# Patient Record
Sex: Female | Born: 1955 | Hispanic: No | Marital: Married | Smoking: Never smoker
Health system: Southern US, Community
[De-identification: ages and names within clinical notes are randomized; demographics above are authoritative.]

## PROBLEM LIST (undated history)

## (undated) DIAGNOSIS — Z87898 Personal history of other specified conditions: Secondary | ICD-10-CM

## (undated) DIAGNOSIS — I38 Endocarditis, valve unspecified: Secondary | ICD-10-CM

## (undated) DIAGNOSIS — M810 Age-related osteoporosis without current pathological fracture: Secondary | ICD-10-CM

## (undated) HISTORY — DX: Personal history of other specified conditions: Z87.898

## (undated) HISTORY — DX: Age-related osteoporosis without current pathological fracture: M81.0

## (undated) HISTORY — DX: Endocarditis, valve unspecified: I38

---

## 2014-06-16 ENCOUNTER — Other Ambulatory Visit (HOSPITAL_COMMUNITY)
Admission: RE | Admit: 2014-06-16 | Discharge: 2014-06-16 | Disposition: A | Discharge status: Home / Self Care | Source: Ambulatory Visit | Attending: Obstetrics & Gynecology | Admitting: Obstetrics & Gynecology

## 2014-06-16 DIAGNOSIS — Z01419 Encounter for gynecological examination (general) (routine) without abnormal findings: Secondary | ICD-10-CM | POA: Insufficient documentation

## 2014-06-16 DIAGNOSIS — Z1151 Encounter for screening for human papillomavirus (HPV): Secondary | ICD-10-CM | POA: Diagnosis present

## 2014-07-12 ENCOUNTER — Institutional Professional Consult (permissible substitution): Payer: Self-pay | Admitting: Cardiology

## 2014-07-28 ENCOUNTER — Encounter: Payer: Self-pay | Admitting: Cardiology

## 2014-07-28 ENCOUNTER — Ambulatory Visit (INDEPENDENT_AMBULATORY_CARE_PROVIDER_SITE_OTHER): Admitting: Cardiology

## 2014-07-28 VITALS — BP 114/68 | HR 82 | Ht 61.0 in | Wt 99.0 lb

## 2014-07-28 DIAGNOSIS — R011 Cardiac murmur, unspecified: Secondary | ICD-10-CM | POA: Insufficient documentation

## 2014-07-28 DIAGNOSIS — R002 Palpitations: Secondary | ICD-10-CM | POA: Insufficient documentation

## 2014-07-28 NOTE — Progress Notes (Signed)
Connie Soto Date of Birth:  [birth year] [facility] [address] [place], [place]  [address] [phone]        Fax   [phone]   History of Present Illness: This pleasant 59 year old woman who has recently moved back to [place] from the [place] is a medical patient of Dr. Shirlean Mylararol Webb.  She is seen to establish cardiology care here in [place].  She has a past history of a mild heart murmur which was diagnosed in 2011 and followed by her previous cardiologist.  She had an echocardiogram several years ago which showed mild mitral regurgitation and mild tricuspid regurgitation.  The patient has a past history of palpitations.  These tend to occur during the day and not at night.  She sleeps well at night.  She has been experiencing the palpitations intermittently for the past 3-4 years.  She denies any history to suggest angina pectoris.  She has not had any symptoms to suggest congestive heart failure.  She reports that when her palpitations were first discovered, she apparently was not drinking enough water and was mildly dehydrated.  Once she made an effort to drink adequate water each day, the palpitations have decreased in intensity and frequency.  The patient does not have any history of high blood pressure diabetes or hypercholesterolemia.  She is a nonsmoker.  Family history reveals that her mother is alive and has a history of TIA.  Her father is deceased and had heart disease and COPD and congestive heart failure.  Current Outpatient Prescriptions  Medication Sig Dispense Refill  . aspirin 81 MG tablet Take 81 mg by mouth 3 (three) times a week. mon wed fri    . ibuprofen (ADVIL,MOTRIN) 200 MG tablet Take 200 mg by mouth every 6 (six) hours as needed for moderate pain.    . metaxalone (SKELAXIN) 800 MG tablet Take 800 mg by mouth daily as needed for muscle spasms.     No current facility-administered medications for this visit.     Allergies  Allergen Reactions  . Neomycin Swelling    Swelling in ear canal    Patient Active Problem List   Diagnosis Date Noted  . Intermittent palpitations 07/28/2014  . Heart murmur 07/28/2014    History  Smoking status  . Never Smoker   Smokeless tobacco  . Not on file    History  Alcohol Use  . 0.0 oz/week  . 0 Standard drinks or equivalent per week    History reviewed. No pertinent family history.  Review of Systems: Constitutional: no fever chills diaphoresis or fatigue or change in weight.  Head and neck: no hearing loss, no epistaxis, no photophobia or visual disturbance. Respiratory: No cough, shortness of breath or wheezing. Cardiovascular: No chest pain peripheral edema, palpitations. Gastrointestinal: No abdominal distention, no abdominal pain, no change in bowel habits hematochezia or melena. Genitourinary: No dysuria, no frequency, no urgency, no nocturia. Musculoskeletal:No arthralgias, no back pain, no gait disturbance or myalgias. Neurological: No dizziness, no headaches, no numbness, no seizures, no syncope, no weakness, no tremors. Hematologic: No lymphadenopathy, no easy bruising. Psychiatric: No confusion, no hallucinations, no sleep disturbance.   Wt Readings from Last 3 Encounters:  07/28/14 99 lb (44.906 kg)    Physical Exam: Filed Vitals:   07/28/14 1442  BP: 114/68  Pulse: 82  The patient appears to be in no distress.  Head and neck exam reveals that the pupils are equal and reactive.  The  extraocular movements are full.  There is no scleral icterus.  Mouth and pharynx are benign.  No lymphadenopathy.  No carotid bruits.  The jugular venous pressure is normal.  Thyroid is not enlarged or tender.  Chest is clear to percussion and auscultation.  No rales or rhonchi.  Expansion of the chest is symmetrical.  Heart reveals no abnormal lift or heave.  First and second heart sounds are normal.  There is no  gallop rub or click.  There  is a very faint systolic murmur at the left sternal border fourth intercostal space.  The abdomen is soft and nontender.  Bowel sounds are normoactive.  There is no hepatosplenomegaly or mass.  There are no abdominal bruits.  Extremities reveal no phlebitis or edema.  Pedal pulses are good.  There is no cyanosis or clubbing.  Neurologic exam is normal strength and no lateralizing weakness.  No sensory deficits.  Integument reveals no rash  EKG today shows normal sinus rhythm with rightward axis and no ischemic changes.  There were no premature beats noted on his EKG.  Assessment / Plan: 1.  History of a heart murmur with previous echo demonstrating mild tricuspid regurgitation and mild mitral regurgitation. 2.  Palpitations  Disposition: We will have the patient return for an echocardiogram to update her information.  At this point she does not require any additional medications.  He does take a baby aspirin 3 days a week and I would suggest that she continue to do this. Many thanks for the opportunity to see this pleasant woman with you.  We will be in touch regarding the results of the echocardiogram.  Return here when necessary.

## 2014-07-28 NOTE — Patient Instructions (Signed)

## 2014-08-02 ENCOUNTER — Ambulatory Visit (HOSPITAL_COMMUNITY): Attending: Cardiology | Admitting: Cardiology

## 2014-08-02 DIAGNOSIS — R011 Cardiac murmur, unspecified: Secondary | ICD-10-CM | POA: Insufficient documentation

## 2014-08-02 DIAGNOSIS — R002 Palpitations: Secondary | ICD-10-CM | POA: Diagnosis present

## 2014-08-02 NOTE — Progress Notes (Signed)
Echo performed. 

## 2014-08-05 ENCOUNTER — Telehealth: Payer: Self-pay | Admitting: Cardiology

## 2014-08-05 NOTE — Telephone Encounter (Signed)
-----   Message from Cassell Clementhomas Brackbill, MD sent at 08/03/2014  5:39 PM EST ----- Please report.  The echocardiogram is normal.  There was trivial tricuspid regurgitation.  Left ventricular systolic function is normal with ejection fraction of 50-55%.  No new medications are indicated. Please send a copy of the echo to Dr. Shirlean Mylararol Webb

## 2014-08-05 NOTE — Telephone Encounter (Signed)
Advised patient

## 2014-08-05 NOTE — Telephone Encounter (Signed)
Follow Up  Pt returned call to nurse

## 2014-09-15 ENCOUNTER — Other Ambulatory Visit: Payer: Self-pay

## 2014-09-15 DIAGNOSIS — Z1231 Encounter for screening mammogram for malignant neoplasm of breast: Secondary | ICD-10-CM

## 2014-09-22 ENCOUNTER — Ambulatory Visit
Admission: RE | Admit: 2014-09-22 | Discharge: 2014-09-22 | Disposition: A | Discharge status: Home / Self Care | Payer: TRICARE For Life (TFL) | Source: Ambulatory Visit

## 2014-09-22 DIAGNOSIS — Z1231 Encounter for screening mammogram for malignant neoplasm of breast: Secondary | ICD-10-CM

## 2014-10-14 ENCOUNTER — Other Ambulatory Visit: Payer: Self-pay | Admitting: Family Medicine

## 2014-10-14 DIAGNOSIS — R928 Other abnormal and inconclusive findings on diagnostic imaging of breast: Secondary | ICD-10-CM

## 2014-10-20 ENCOUNTER — Ambulatory Visit
Admission: RE | Admit: 2014-10-20 | Discharge: 2014-10-20 | Disposition: A | Discharge status: Home / Self Care | Source: Ambulatory Visit | Attending: Family Medicine | Admitting: Family Medicine

## 2014-10-20 DIAGNOSIS — R928 Other abnormal and inconclusive findings on diagnostic imaging of breast: Secondary | ICD-10-CM

## 2015-09-06 ENCOUNTER — Other Ambulatory Visit: Payer: Self-pay

## 2015-09-06 DIAGNOSIS — Z1231 Encounter for screening mammogram for malignant neoplasm of breast: Secondary | ICD-10-CM

## 2015-09-15 ENCOUNTER — Other Ambulatory Visit: Payer: Self-pay

## 2015-09-15 DIAGNOSIS — Z1231 Encounter for screening mammogram for malignant neoplasm of breast: Secondary | ICD-10-CM

## 2015-09-27 ENCOUNTER — Ambulatory Visit
Admission: RE | Admit: 2015-09-27 | Discharge: 2015-09-27 | Disposition: A | Discharge status: Home / Self Care | Source: Ambulatory Visit

## 2015-09-27 DIAGNOSIS — Z1231 Encounter for screening mammogram for malignant neoplasm of breast: Secondary | ICD-10-CM

## 2015-09-28 ENCOUNTER — Ambulatory Visit

## 2015-09-29 ENCOUNTER — Other Ambulatory Visit: Payer: Self-pay | Admitting: Family Medicine

## 2015-09-29 DIAGNOSIS — R928 Other abnormal and inconclusive findings on diagnostic imaging of breast: Secondary | ICD-10-CM

## 2015-10-07 ENCOUNTER — Ambulatory Visit
Admission: RE | Admit: 2015-10-07 | Discharge: 2015-10-07 | Disposition: A | Discharge status: Home / Self Care | Source: Ambulatory Visit | Attending: Family Medicine | Admitting: Family Medicine

## 2015-10-07 DIAGNOSIS — R928 Other abnormal and inconclusive findings on diagnostic imaging of breast: Secondary | ICD-10-CM

## 2016-07-20 ENCOUNTER — Other Ambulatory Visit: Payer: Self-pay | Admitting: Obstetrics & Gynecology

## 2016-07-20 DIAGNOSIS — Z1231 Encounter for screening mammogram for malignant neoplasm of breast: Secondary | ICD-10-CM

## 2016-08-07 ENCOUNTER — Telehealth: Payer: Self-pay

## 2016-08-07 NOTE — Telephone Encounter (Signed)
NOTES SENT TO SCHEDULING.  °

## 2016-08-16 ENCOUNTER — Encounter: Payer: Self-pay | Admitting: Cardiology

## 2016-08-27 ENCOUNTER — Ambulatory Visit (INDEPENDENT_AMBULATORY_CARE_PROVIDER_SITE_OTHER)
Admission: RE | Admit: 2016-08-27 | Discharge: 2016-08-27 | Disposition: A | Discharge status: Home / Self Care | Payer: Self-pay | Source: Ambulatory Visit | Attending: Cardiology | Admitting: Cardiology

## 2016-08-27 ENCOUNTER — Encounter (INDEPENDENT_AMBULATORY_CARE_PROVIDER_SITE_OTHER): Payer: Self-pay

## 2016-08-27 ENCOUNTER — Ambulatory Visit (INDEPENDENT_AMBULATORY_CARE_PROVIDER_SITE_OTHER): Admitting: Cardiology

## 2016-08-27 ENCOUNTER — Encounter: Payer: Self-pay | Admitting: Cardiology

## 2016-08-27 VITALS — BP 110/56 | HR 92 | Ht 61.0 in | Wt 93.0 lb

## 2016-08-27 DIAGNOSIS — E784 Other hyperlipidemia: Secondary | ICD-10-CM | POA: Diagnosis not present

## 2016-08-27 DIAGNOSIS — R002 Palpitations: Secondary | ICD-10-CM | POA: Diagnosis not present

## 2016-08-27 DIAGNOSIS — E7849 Other hyperlipidemia: Secondary | ICD-10-CM

## 2016-08-27 DIAGNOSIS — Z8249 Family history of ischemic heart disease and other diseases of the circulatory system: Secondary | ICD-10-CM

## 2016-08-27 DIAGNOSIS — E785 Hyperlipidemia, unspecified: Secondary | ICD-10-CM | POA: Insufficient documentation

## 2016-08-27 NOTE — Progress Notes (Addendum)
Cardiology Office Note    Date:  08/27/2016   ID:  Connie Soto, DOB 06/07/1955, MRN [id number]  PCP:  Frederich Chick, MD  Cardiologist:  Tobias Alexander, MD  Referring physician: Shirlean Mylar, MD  Chief complain: Palpitations, family history of premature coronary artery disease  History of Present Illness:  Connie Soto is a 61 y.o. female, Who is a very pleasant patient, previously seen by Dr. Patty Sermons. The patient is coming for concern of hyperlipidemia, palpitations and sudden cardiac death in her brother. Her brother was 76 with no known coronary artery disease and no chest pain died suddenly and was found that at work. The patient is very active and walks treadmill and lifts weights 4-5 times a week, and is asymptomatic with chest pain or shortness of breath. She also denies any lower extremity edema, no claudications no orthopnea or paroxysmal nocturnal dyspnea. She has noticed a few second lasting palpitations they're not associated with shortness of breath or chest pain dizziness or syncope. She eats very healthy mostly vegetarian diet and was told in the past that her cholesterol is borderline but does need to be treated. She also has history of osteoporosis and arthritis for which she takes occasional ibuprofen.  Past Medical History:  Diagnosis Date  . History of palpitations   . Osteoporosis   . Valvular regurgitation     No past surgical history on file.  Current Medications: Outpatient Medications Prior to Visit  Medication Sig Dispense Refill  . aspirin 81 MG tablet Take 81 mg by mouth 3 (three) times a week. mon wed fri    . ibuprofen (ADVIL,MOTRIN) 200 MG tablet Take 200 mg by mouth every 6 (six) hours as needed for moderate pain.    . metaxalone (SKELAXIN) 800 MG tablet Take 800 mg by mouth daily as needed for muscle spasms.     No facility-administered medications prior to visit.      Allergies:   Neomycin   Social History   Social History  .  Marital status: Married    Spouse name: N/A  . Number of children: N/A  . Years of education: N/A   Social History Main Topics  . Smoking status: Never Smoker  . Smokeless tobacco: Never Used  . Alcohol use 0.0 oz/week  . Drug use: No  . Sexual activity: Not Asked   Other Topics Concern  . None   Social History Narrative  . None     Family History:  The patient's family history includes Breast cancer in her paternal grandmother; COPD in her father; Emphysema in her father; Healthy in her daughter, daughter, and son; Heart Problems in her father; Heart attack in her brother; Hypertension in her father; Scoliosis in her mother; Transient ischemic attack in her mother.   ROS:   Please see the history of present illness.    ROS All other systems reviewed and are negative.   PHYSICAL EXAM:   VS:  BP (!) 110/56   Pulse 92   Ht 5\' 1"  (1.549 m)   Wt 93 lb (42.2 kg)   BMI 17.57 kg/m    GEN: Well nourished, well developed, in no acute distress  HEENT: normal  Neck: no JVD, carotid bruits, or masses Cardiac: RRR; no murmurs, rubs, or gallops,no edema  Respiratory:  clear to auscultation bilaterally, normal work of breathing GI: soft, nontender, nondistended, + BS MS: no deformity or atrophy  Skin: warm and dry, no rash Neuro:  Alert and Oriented x 3,  Strength and sensation are intact Psych: euthymic mood, full affect  Wt Readings from Last 3 Encounters:  08/27/16 93 lb (42.2 kg)  07/28/14 99 lb (44.9 kg)      Studies/Labs Reviewed:   EKG:  EKG is ordered today.  The ekg ordered today demonstrates Normal sinus rhythm with rightward axis, this is unchanged from prior EKG in 2016. This was personally reviewed.  Recent Labs: No results found for requested labs within last 8760 hours.   Lipid Panel No results found for: CHOL, TRIG, HDL, CHOLHDL, VLDL, LDLCALC, LDLDIRECT  Additional studies/ records that were reviewed today include:   TTE:  - Left ventricle: The cavity  size was normal. Wall thickness was normal. Systolic function was normal. The estimated ejection fraction was in the range of 50% to 55%. Wall motion was normal; there were no regional wall motion abnormalities.    ASSESSMENT:    1. Intermittent palpitations   2. Family history of early CAD   3. Other hyperlipidemia      PLAN:  In order of problems listed above:  1. The patient is asymptomatic but so was her brother who died suddenly. We will schedule an exercise treadmill stress test. 2. Hyperlipidemia, triglycerides 44 HDL 92 LDL 108. We will order calcium score to further risk stratify and determine if she needs therapy with statins., Meanwhile she is advised to continue exercising and healthy Mediterranean style diet. 3. Calcium score was personally reviewed, and it was 0, the patient was called, and recommended to start just red yeast right 600 mg daily but no prescription statin is needed. 4. Palpitations, minimal no Holter monitor needed at this time.    Medication Adjustments/Labs and Tests Ordered: Current medicines are reviewed at length with the patient today.  Concerns regarding medicines are outlined above.  Medication changes, Labs and Tests ordered today are listed in the Patient Instructions below. Patient Instructions  Medication Instructions:   Your physician recommends that you continue on your current medications as directed. Please refer to the Current Medication list given to you today.    Testing/Procedures:  Your physician has requested that you have an exercise tolerance test. For further information please visit [email or web address]. Please also follow instruction sheet, as given.   YOU WILL HAVE A CT CARDIAC SCORING TEST (CALCIUM SCORE) DONE TODAY IN OUR OFFICE     Follow-Up:  Your physician wants you to follow-up in: ONE YEAR WITH DR Johnell ComingsNELSON You will receive a reminder letter in the mail two months in advance. If you don't receive a  letter, please call our office to schedule the follow-up appointment.        If you need a refill on your cardiac medications before your next appointment, please call your pharmacy.      Signed, Tobias AlexanderKatarina Shweta Aman, MD  08/27/2016 9:35 AM    [facility] [address], [place], [place]  [address] Phone: [phone]; Fax: [phone]

## 2016-08-27 NOTE — Addendum Note (Signed)
Addended by: Lars MassonNELSON, Zekiel Torian H on: 08/27/2016 10:51 AM   Modules accepted: Level of Service

## 2016-08-27 NOTE — Patient Instructions (Signed)
Medication Instructions:   Your physician recommends that you continue on your current medications as directed. Please refer to the Current Medication list given to you today.    Testing/Procedures:  Your physician has requested that you have an exercise tolerance test. For further information please visit [email or web address] Please also follow instruction sheet, as given.   YOU WILL HAVE A CT CARDIAC SCORING TEST (CALCIUM SCORE) DONE TODAY IN OUR OFFICE     Follow-Up:  Your physician wants you to follow-up in: ONE YEAR WITH DR Johnell ComingsNELSON You will receive a reminder letter in the mail two months in advance. If you don't receive a letter, please call our office to schedule the follow-up appointment.        If you need a refill on your cardiac medications before your next appointment, please call your pharmacy.

## 2016-09-04 ENCOUNTER — Ambulatory Visit (INDEPENDENT_AMBULATORY_CARE_PROVIDER_SITE_OTHER)

## 2016-09-04 DIAGNOSIS — R002 Palpitations: Secondary | ICD-10-CM | POA: Diagnosis not present

## 2016-09-04 DIAGNOSIS — Z8249 Family history of ischemic heart disease and other diseases of the circulatory system: Secondary | ICD-10-CM | POA: Diagnosis not present

## 2016-09-04 DIAGNOSIS — E784 Other hyperlipidemia: Secondary | ICD-10-CM

## 2016-09-04 DIAGNOSIS — E7849 Other hyperlipidemia: Secondary | ICD-10-CM

## 2016-09-04 LAB — EXERCISE TOLERANCE TEST
Estimated workload: 13.4 METS
Exercise duration (min): 11 min
Exercise duration (sec): 0 s
MPHR: 160 {beats}/min
Peak HR: 160 {beats}/min
Percent HR: 101 %
RPE: 15
Rest HR: 82 {beats}/min

## 2016-10-24 ENCOUNTER — Ambulatory Visit
Admission: RE | Admit: 2016-10-24 | Discharge: 2016-10-24 | Disposition: A | Discharge status: Home / Self Care | Source: Ambulatory Visit | Attending: Obstetrics & Gynecology | Admitting: Obstetrics & Gynecology

## 2016-10-24 ENCOUNTER — Other Ambulatory Visit: Payer: Self-pay | Admitting: Obstetrics & Gynecology

## 2016-10-24 DIAGNOSIS — Z1231 Encounter for screening mammogram for malignant neoplasm of breast: Secondary | ICD-10-CM

## 2017-08-30 ENCOUNTER — Other Ambulatory Visit: Payer: Self-pay | Admitting: Obstetrics & Gynecology

## 2017-08-30 DIAGNOSIS — Z1231 Encounter for screening mammogram for malignant neoplasm of breast: Secondary | ICD-10-CM

## 2017-09-20 ENCOUNTER — Encounter: Payer: Self-pay | Admitting: Physician Assistant

## 2017-09-26 ENCOUNTER — Ambulatory Visit: Admitting: Physician Assistant

## 2017-09-27 ENCOUNTER — Other Ambulatory Visit: Payer: Self-pay | Admitting: Obstetrics & Gynecology

## 2017-09-27 DIAGNOSIS — N644 Mastodynia: Secondary | ICD-10-CM

## 2017-10-01 NOTE — Progress Notes (Addendum)
Cardiology Office Note    Date:  10/02/2017   ID:  Don Perking, DOB 18-Jan-1956, MRN [id number]  PCP:  Shirlean Mylar, MD  Cardiologist: Tobias Alexander, MD  Chief Complaint  Patient presents with  . Follow-up    History of Present Illness:  Connie Soto is a 62 y.o. female with history of palpitations, HLD-borderline diet-controlled and strong family history of early CAD.  Brother died suddenly at 74 of an MI.  2D echo in 2016 normal.  Last saw Dr. Delton See 08/2016 who ordered a GXT as well as calcium score to risk stratify and determine if she needs therapy with statins.  Calcium score was 0 red yeast rice 600 mg daily was recommended.  GXT normal 09/2016.  Patient comes in for yearly follow-up.  She has occasional palpitations but can cough and it subsides.  She is been a little dizzy or off balance but thinks she is not drinking enough water and is going to focus on staying hydrated.  No presyncope.  Denies chest pain, dyspnea, dyspnea on exertion, edema.  Exercising on the treadmill or elliptical 30 minutes 4 days a week and also doing weights.  Not doing as much recently because of plantar fasciitis.  Lipid profile done in March shows an LDL of 103 which she was concerned about because she is taking the red yeast rice.  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    Past Medical History:  Diagnosis Date  . History of palpitations   . Osteoporosis   . Valvular regurgitation     No past surgical history on file.  Current Medications: Current Meds  Medication Sig  . Calcium Carb-Cholecalciferol (CALCIUM + D3) 600-200 MG-UNIT TABS Take by mouth daily.  Marland Kitchen ibuprofen (ADVIL,MOTRIN) 200 MG tablet Take 200 mg by mouth every 6 (six) hours as needed for moderate pain.  . Magnesium 200 MG TABS Take 200 mg by mouth daily.  . Multiple Vitamin (MULTIVITAMIN) tablet Take 1 tablet by mouth daily.  . Red Yeast Rice 600 MG CAPS Take 600 mg by mouth daily.  . [DISCONTINUED] aspirin  81 MG tablet Take 81 mg by mouth 3 (three) times a week. mon wed fri     Allergies:   Neomycin   Social History   Socioeconomic History  . Marital status: Married    Spouse name: Not on file  . Number of children: Not on file  . Years of education: Not on file  . Highest education level: Not on file  Occupational History  . Not on file  Social Needs  . Financial resource strain: Not on file  . Food insecurity:    Worry: Not on file    Inability: Not on file  . Transportation needs:    Medical: Not on file    Non-medical: Not on file  Tobacco Use  . Smoking status: Never Smoker  . Smokeless tobacco: Never Used  Substance and Sexual Activity  . Alcohol use: Yes    Alcohol/week: 0.0 oz  . Drug use: No  . Sexual activity: Not on file  Lifestyle  . Physical activity:    Days per week: Not on file    Minutes per session: Not on file  . Stress: Not on file  Relationships  . Social connections:    Talks on phone: Not on file    Gets together: Not on file    Attends religious service: Not on file    Active member of club or  organization: Not on file    Attends meetings of clubs or organizations: Not on file    Relationship status: Not on file  Other Topics Concern  . Not on file  Social History Narrative  . Not on file     Family History:  The patient's family history includes Breast cancer in her maternal grandmother and paternal grandmother; COPD in her father; Emphysema in her father; Healthy in her daughter, daughter, and son; Heart Problems in her father; Heart attack in her brother; Hypertension in her father; Scoliosis in her mother; Transient ischemic attack in her mother.   ROS:   Please see the history of present illness.    Review of Systems  Constitution: Negative.  HENT: Negative.   Eyes: Negative.   Cardiovascular: Positive for palpitations.  Respiratory: Negative.   Hematologic/Lymphatic: Negative.   Musculoskeletal: Negative.  Negative for joint  pain.       Plantar fasciitis  Gastrointestinal: Negative.   Genitourinary: Negative.   Neurological: Positive for light-headedness and loss of balance.   All other systems reviewed and are negative.   PHYSICAL EXAM:   VS:  BP 108/70   Pulse 80   Ht 5' 0.5" (1.537 m)   Wt 97 lb (44 kg)   BMI 18.63 kg/m   Physical Exam  GEN: Thin, in no acute distress  Neck: no JVD, carotid bruits, or masses Cardiac:RRR; no murmurs, rubs, or gallops  Respiratory:  clear to auscultation bilaterally, normal work of breathing GI: soft, nontender, nondistended, + BS Ext: without cyanosis, clubbing, or edema, Good distal pulses bilaterally Neuro:  Alert and Oriented x 3 Psych: euthymic mood, full affect  Wt Readings from Last 3 Encounters:  10/02/17 97 lb (44 kg)  08/27/16 93 lb (42.2 kg)  07/28/14 99 lb (44.9 kg)      Studies/Labs Reviewed:   EKG:  EKG is  ordered today.  The ekg ordered today demonstrates normal sinus rhythm, normal EKG  Recent Labs: No results found for requested labs within last 8760 hours.   Lipid Panel No results found for: CHOL, TRIG, HDL, CHOLHDL, VLDL, LDLCALC, LDLDIRECT  Additional studies/ records that were reviewed today include:  GXT 09/2016 Study Highlights      Blood pressure demonstrated a normal response to exercise.  There was no ST segment deviation noted during stress.  No T wave inversion was noted during stress.   Normal ECG stress test.     2D echo 2016Study Conclusions  - Left ventricle: The cavity size was normal. Wall thickness was   normal. Systolic function was normal. The estimated ejection   fraction was in the range of 50% to 55%. Wall motion was normal;   there were no regional wall motion abnormalities.  CT calcium score 3/2018IMPRESSION: Coronary calcium score of 0. This was 0 percentile for age and sex matched control.   Tobias Alexander     Electronically Signed   By: Tobias Alexander   On: 08/27/2016 10:46       ASSESSMENT:    1. Intermittent palpitations   2. Mixed hyperlipidemia   3. Family history of early CAD      PLAN:  In order of problems listed above:  Intermittent palpitations relieved with cough not bothersome.  No Holter at this time unless recurrent symptoms.  Hyperlipidemia calcium score is 0 so no statin needed.  Red yeast rice recommended.  LDL 08/27/2017 103 total cholesterol 207.  Could try soluble fiber to see if this helps.  Patient follows a Mediterranean diet and is very healthy.  Family history of early CAD with brother dying at 101  Normal GXT 09/2016, calcium score 0.  Continue to monitor yearly.  Can stop aspirin based on recent data.    Medication Adjustments/Labs and Tests Ordered: Current medicines are reviewed at length with the patient today.  Concerns regarding medicines are outlined above.  Medication changes, Labs and Tests ordered today are listed in the Patient Instructions below. Patient Instructions  Medication Instructions:  Your physician has recommended you make the following change in your medication:  1- STOP Aspirin  Labwork: NONE  Testing/Procedures: NONE  Follow-Up: Your physician wants you to follow-up in: 12 months with Dr. Delton See. You will receive a reminder letter in the mail two months in advance. If you don't receive a letter, please call our office to schedule the follow-up appointment.  Your physician recommended that you exercise 150 minutes per week or 30 minutes 4 times a week plus weight training.    If you need a refill on your cardiac medications before your next appointment, please call your pharmacy.    Exercising to Stay Healthy Exercising regularly is important. It has many health benefits, such as:  Improving your overall fitness, flexibility, and endurance.  Increasing your bone density.  Helping with weight control.  Decreasing your body fat.  Increasing your muscle strength.  Reducing stress and  tension.  Improving your overall health.  In order to become healthy and stay healthy, it is recommended that you do moderate-intensity and vigorous-intensity exercise. You can tell that you are exercising at a moderate intensity if you have a higher heart rate and faster breathing, but you are still able to hold a conversation. You can tell that you are exercising at a vigorous intensity if you are breathing much harder and faster and cannot hold a conversation while exercising. How often should I exercise? Choose an activity that you enjoy and set realistic goals. Your health care provider can help you to make an activity plan that works for you. Exercise regularly as directed by your health care provider. This may include:  Doing resistance training twice each week, such as: ? Push-ups. ? Sit-ups. ? Lifting weights. ? Using resistance bands.  Doing a given intensity of exercise for a given amount of time. Choose from these options: ? 150 minutes of moderate-intensity exercise every week. ? 75 minutes of vigorous-intensity exercise every week. ? A mix of moderate-intensity and vigorous-intensity exercise every week.  Children, pregnant women, people who are out of shape, people who are overweight, and older adults may need to consult a health care provider for individual recommendations. If you have any sort of medical condition, be sure to consult your health care provider before starting a new exercise program. What are some exercise ideas? Some moderate-intensity exercise ideas include:  Walking at a rate of 1 mile in 15 minutes.  Biking.  Hiking.  Golfing.  Dancing.  Some vigorous-intensity exercise ideas include:  Walking at a rate of at least 4.5 miles per hour.  Jogging or running at a rate of 5 miles per hour.  Biking at a rate of at least 10 miles per hour.  Lap swimming.  Roller-skating or in-line skating.  Cross-country skiing.  Vigorous competitive sports,  such as football, basketball, and soccer.  Jumping rope.  Aerobic dancing.  What are some everyday activities that can help me to get exercise?  Yard work, such as: ? Pushing a lawn  mower. ? Raking and bagging leaves.  Washing and waxing your car.  Pushing a stroller.  Shoveling snow.  Gardening.  Washing windows or floors. How can I be more active in my day-to-day activities?  Use the stairs instead of the elevator.  Take a walk during your lunch break.  If you drive, park your car farther away from work or school.  If you take public transportation, get off one stop early and walk the rest of the way.  Make all of your phone calls while standing up and walking around.  Get up, stretch, and walk around every 30 minutes throughout the day. What guidelines should I follow while exercising?  Do not exercise so much that you hurt yourself, feel dizzy, or get very short of breath.  Consult your health care provider before starting a new exercise program.  Wear comfortable clothes and shoes with good support.  Drink plenty of water while you exercise to prevent dehydration or heat stroke. Body water is lost during exercise and must be replaced.  Work out until you breathe faster and your heart beats faster. This information is not intended to replace advice given to you by your health care provider. Make sure you discuss any questions you have with your health care provider. Document Released: 06/23/2010 Document Revised: 10/27/2015 Document Reviewed: 10/22/2013 Elsevier Interactive Patient Education  [address].     Signed, Jacolyn Reedy, [place]  10/02/2017 11:31 AM    [facility] [address] [place], [place], [place]  [address] Phone: [phone]; Fax: [phone]

## 2017-10-02 ENCOUNTER — Ambulatory Visit (INDEPENDENT_AMBULATORY_CARE_PROVIDER_SITE_OTHER): Admitting: Physician Assistant

## 2017-10-02 ENCOUNTER — Encounter: Payer: Self-pay | Admitting: Physician Assistant

## 2017-10-02 VITALS — BP 108/70 | HR 80 | Ht 60.5 in | Wt 97.0 lb

## 2017-10-02 DIAGNOSIS — E782 Mixed hyperlipidemia: Secondary | ICD-10-CM | POA: Diagnosis not present

## 2017-10-02 DIAGNOSIS — Z8249 Family history of ischemic heart disease and other diseases of the circulatory system: Secondary | ICD-10-CM

## 2017-10-02 DIAGNOSIS — R002 Palpitations: Secondary | ICD-10-CM

## 2017-10-02 NOTE — Patient Instructions (Addendum)
Medication Instructions:  Your physician has recommended you make the following change in your medication:  1- STOP Aspirin  Labwork: NONE  Testing/Procedures: NONE  Follow-Up: Your physician wants you to follow-up in: 12 months with Dr. Delton See. You will receive a reminder letter in the mail two months in advance. If you don't receive a letter, please call our office to schedule the follow-up appointment.  Your physician recommended that you exercise 150 minutes per week or 30 minutes 4 times a week plus weight training.    If you need a refill on your cardiac medications before your next appointment, please call your pharmacy.    Exercising to Stay Healthy Exercising regularly is important. It has many health benefits, such as:  Improving your overall fitness, flexibility, and endurance.  Increasing your bone density.  Helping with weight control.  Decreasing your body fat.  Increasing your muscle strength.  Reducing stress and tension.  Improving your overall health.  In order to become healthy and stay healthy, it is recommended that you do moderate-intensity and vigorous-intensity exercise. You can tell that you are exercising at a moderate intensity if you have a higher heart rate and faster breathing, but you are still able to hold a conversation. You can tell that you are exercising at a vigorous intensity if you are breathing much harder and faster and cannot hold a conversation while exercising. How often should I exercise? Choose an activity that you enjoy and set realistic goals. Your health care provider can help you to make an activity plan that works for you. Exercise regularly as directed by your health care provider. This may include:  Doing resistance training twice each week, such as: ? Push-ups. ? Sit-ups. ? Lifting weights. ? Using resistance bands.  Doing a given intensity of exercise for a given amount of time. Choose from these options: ? 150  minutes of moderate-intensity exercise every week. ? 75 minutes of vigorous-intensity exercise every week. ? A mix of moderate-intensity and vigorous-intensity exercise every week.  Children, pregnant women, people who are out of shape, people who are overweight, and older adults may need to consult a health care provider for individual recommendations. If you have any sort of medical condition, be sure to consult your health care provider before starting a new exercise program. What are some exercise ideas? Some moderate-intensity exercise ideas include:  Walking at a rate of 1 mile in 15 minutes.  Biking.  Hiking.  Golfing.  Dancing.  Some vigorous-intensity exercise ideas include:  Walking at a rate of at least 4.5 miles per hour.  Jogging or running at a rate of 5 miles per hour.  Biking at a rate of at least 10 miles per hour.  Lap swimming.  Roller-skating or in-line skating.  Cross-country skiing.  Vigorous competitive sports, such as football, basketball, and soccer.  Jumping rope.  Aerobic dancing.  What are some everyday activities that can help me to get exercise?  Yard work, such as: ? Pushing a Surveyor, mining. ? Raking and bagging leaves.  Washing and waxing your car.  Pushing a stroller.  Shoveling snow.  Gardening.  Washing windows or floors. How can I be more active in my day-to-day activities?  Use the stairs instead of the elevator.  Take a walk during your lunch break.  If you drive, park your car farther away from work or school.  If you take public transportation, get off one stop early and walk the rest of the way.  Make all of your phone calls while standing up and walking around.  Get up, stretch, and walk around every 30 minutes throughout the day. What guidelines should I follow while exercising?  Do not exercise so much that you hurt yourself, feel dizzy, or get very short of breath.  Consult your health care provider before  starting a new exercise program.  Wear comfortable clothes and shoes with good support.  Drink plenty of water while you exercise to prevent dehydration or heat stroke. Body water is lost during exercise and must be replaced.  Work out until you breathe faster and your heart beats faster. This information is not intended to replace advice given to you by your health care provider. Make sure you discuss any questions you have with your health care provider. Document Released: 06/23/2010 Document Revised: 10/27/2015 Document Reviewed: 10/22/2013 Elsevier Interactive Patient Education  [employer].

## 2017-10-25 ENCOUNTER — Other Ambulatory Visit

## 2017-10-25 ENCOUNTER — Encounter

## 2017-10-29 ENCOUNTER — Ambulatory Visit
Admission: RE | Admit: 2017-10-29 | Discharge: 2017-10-29 | Disposition: A | Discharge status: Home / Self Care | Source: Ambulatory Visit | Attending: Obstetrics & Gynecology | Admitting: Obstetrics & Gynecology

## 2017-10-29 DIAGNOSIS — N644 Mastodynia: Secondary | ICD-10-CM

## 2017-11-04 ENCOUNTER — Ambulatory Visit

## 2018-02-19 ENCOUNTER — Telehealth: Payer: Self-pay | Admitting: Physician Assistant

## 2018-02-19 NOTE — Telephone Encounter (Signed)
New Message   Pt is calling to see if she has a heart murmur or irregular heart beats. States she is trying to get life insurance and that is a question they require. Please call

## 2018-02-19 NOTE — Telephone Encounter (Signed)
Pt aware echo shows trivial tricuspid regurg which maybe heard as murmur and stress test was normal ./cy

## 2018-10-02 ENCOUNTER — Telehealth: Payer: Self-pay

## 2018-10-02 NOTE — Telephone Encounter (Signed)
Called pt to set up possible evisit, says she is not having any sx and would rather wait till August.

## 2018-10-06 NOTE — Telephone Encounter (Signed)
   Primary Cardiologist:  Tobias Alexander, MD   Patient contacted.  History reviewed.  No symptoms to suggest any unstable cardiac conditions.  Based on discussion, with current pandemic situation, we will be postponing this appointment for Connie Soto with a plan for f/u in the summer with an in office visit.  If symptoms change, she has been instructed to contact our office. Recall placed for August  Lattie Haw, RN  10/06/2018 9:15 AM         .

## 2018-10-07 ENCOUNTER — Ambulatory Visit: Admitting: Physician Assistant

## 2018-12-12 ENCOUNTER — Other Ambulatory Visit: Payer: Self-pay | Admitting: Obstetrics & Gynecology

## 2018-12-12 DIAGNOSIS — Z1231 Encounter for screening mammogram for malignant neoplasm of breast: Secondary | ICD-10-CM

## 2019-01-28 ENCOUNTER — Ambulatory Visit

## 2019-02-16 NOTE — Progress Notes (Signed)
Cardiology Office Note    Date:  02/17/2019   ID:  Connie Soto, DOB [birth year], MRN [id number]  PCP:  Maurice Small, MD  Cardiologist: Ena Dawley, MD EPS: None  Chief Complaint  Patient presents with  . Follow-up    History of Present Illness:  Connie Soto is a 63 y.o. female  with history of palpitations, HLD-borderline diet-controlled and strong family history of early CAD.  Brother died suddenly at 67 of an MI.  2D echo in 2016 normal.   Last saw Dr. Meda Coffee 08/2016 who ordered a GXT as well as calcium score to risk stratify and determine if she needs therapy with statins.  Calcium score was 0 red yeast rice 600 mg daily was recommended.  GXT normal 09/2016.  I last saw this patient 10/2017 at which time she had intermittent palpitations relieved with coughing and LDL was 103 on red yeast rice.  Patient here for yearly f/u. Has had some dizziness and a sensation that she is going to fall. Has been wearing a mask since son visiting and not drinking enough H2O. Walking 45 min/day, eating healthy. Has lost 2 lbs. Occasional palpitations relieved with walking.  Past Medical History:  Diagnosis Date  . History of palpitations   . Osteoporosis   . Valvular regurgitation     No past surgical history on file.  Current Medications: Current Meds  Medication Sig  . Calcium Carb-Cholecalciferol (CALCIUM + D3) 600-200 MG-UNIT TABS Take by mouth daily.  Marland Kitchen ibuprofen (ADVIL,MOTRIN) 200 MG tablet Take 200 mg by mouth every 6 (six) hours as needed for moderate pain.  . Multiple Vitamin (MULTIVITAMIN) tablet Take 1 tablet by mouth daily.  . Red Yeast Rice 600 MG CAPS Take 600 mg by mouth daily.     Allergies:   Neomycin   Social History   Socioeconomic History  . Marital status: Married    Spouse name: Not on file  . Number of children: Not on file  . Years of education: Not on file  . Highest education level: Not on file  Occupational History  . Not on file   Social Needs  . Financial resource strain: Not on file  . Food insecurity    Worry: Not on file    Inability: Not on file  . Transportation needs    Medical: Not on file    Non-medical: Not on file  Tobacco Use  . Smoking status: Never Smoker  . Smokeless tobacco: Never Used  Substance and Sexual Activity  . Alcohol use: Yes    Alcohol/week: 0.0 standard drinks  . Drug use: No  . Sexual activity: Not on file  Lifestyle  . Physical activity    Days per week: Not on file    Minutes per session: Not on file  . Stress: Not on file  Relationships  . Social Herbalist on phone: Not on file    Gets together: Not on file    Attends religious service: Not on file    Active member of club or organization: Not on file    Attends meetings of clubs or organizations: Not on file    Relationship status: Not on file  Other Topics Concern  . Not on file  Social History Narrative  . Not on file     Family History:  The patient's family history includes Breast cancer in her maternal grandmother and paternal grandmother; COPD in her father; Emphysema in her father; Healthy in her daughter,  daughter, and son; Heart Problems in her father; Heart attack in her brother; Hypertension in her father; Scoliosis in her mother; Transient ischemic attack in her mother.   ROS:   Please see the history of present illness.    ROS All other systems reviewed and are negative.   PHYSICAL EXAM:   VS:  Ht 5' (1.524 m)   Wt 95 lb (43.1 kg)   SpO2 93%   BMI 18.55 kg/m   Physical Exam  GEN: Well nourished, well developed, in no acute distress  Neck: no JVD, carotid bruits, or masses Cardiac:RRR; no murmurs, rubs, or gallops  Respiratory:  clear to auscultation bilaterally, normal work of breathing GI: soft, nontender, nondistended, + BS Ext: without cyanosis, clubbing, or edema, Good distal pulses bilaterally Neuro:  Alert and Oriented x 3 Psych: euthymic mood, full affect  Wt Readings  from Last 3 Encounters:  02/17/19 95 lb (43.1 kg)  10/02/17 97 lb (44 kg)  08/27/16 93 lb (42.2 kg)      Studies/Labs Reviewed:   EKG:  EKG is  ordered today.  The ekg ordered today demonstrates NSR  Recent Labs: No results found for requested labs within last 8760 hours.   Lipid Panel No results found for: CHOL, TRIG, HDL, CHOLHDL, VLDL, LDLCALC, LDLDIRECT  Additional studies/ records that were reviewed today include:  GXT 09/2016 Study Highlights       Blood pressure demonstrated a normal response to exercise.  There was no ST segment deviation noted during stress.  No T wave inversion was noted during stress.   Normal ECG stress test.      2D echo 2016Study Conclusions  - Left ventricle: The cavity size was normal. Wall thickness was   normal. Systolic function was normal. The estimated ejection   fraction was in the range of 50% to 55%. Wall motion was normal;   there were no regional wall motion abnormalities.   CT calcium score 3/2018IMPRESSION: Coronary calcium score of 0. This was 0 percentile for age and sex matched control.   Tobias AlexanderKatarina Nelson     Electronically Signed   By: Tobias AlexanderKatarina  Nelson   On: 08/27/2016 10:46       ASSESSMENT:    1. Family history of early CAD   2. Other hyperlipidemia   3. Intermittent palpitations   4. Dizziness      PLAN:  In order of problems listed above:  Family history of early CAD calcium score 0 on coronary CT 2018-no chest pain or cardiac complaints. Walking 45 min daily. F/u in 1 yr  Hyperlipidemia on red yeast rice-for FLP next week by PCP  History of palpitations-very rare and relieved with cough  Dizziness since wearing a mask with son visiting. Not drinking enough water. Not orthostatic. She'll keep track of her BP and call if recurrence. For surveillance blood work by PCP next week.    Medication Adjustments/Labs and Tests Ordered: Current medicines are reviewed at length with the patient today.   Concerns regarding medicines are outlined above.  Medication changes, Labs and Tests ordered today are listed in the Patient Instructions below. Patient Instructions  Medication Instructions:  Your physician recommends that you continue on your current medications as directed. Please refer to the Current Medication list given to you today.  If you need a refill on your cardiac medications before your next appointment, please call your pharmacy.   Lab work: None Ordered  Testing/Procedures: None Ordered  Follow-Up: At [facility], you and your  health needs are our priority.  As part of our continuing mission to provide you with exceptional heart care, we have created designated Provider Care Teams.  These Care Teams include your primary Cardiologist (physician) and Advanced Practice Providers (APPs -  Physician Assistants and Nurse Practitioners) who all work together to provide you with the care you need, when you need it. You will need a follow up appointment in 1 years.  Please call our office 2 months in advance to schedule this appointment.  You may see Tobias Alexander, MD or one of the following Advanced Practice Providers on your designated Care Team:   [place], PA-C Ronie Spies, PA-C . Jacolyn Reedy, PA-C         Signed, Jacolyn Reedy, PA-C  02/17/2019 1:25 PM    [facility] [address] [place], [place], [place]  [address] Phone: [phone]; Fax: [phone]

## 2019-02-17 ENCOUNTER — Encounter: Payer: Self-pay | Admitting: Physician Assistant

## 2019-02-17 ENCOUNTER — Other Ambulatory Visit: Payer: Self-pay

## 2019-02-17 ENCOUNTER — Ambulatory Visit (INDEPENDENT_AMBULATORY_CARE_PROVIDER_SITE_OTHER): Admitting: Physician Assistant

## 2019-02-17 VITALS — Ht 60.0 in | Wt 95.0 lb

## 2019-02-17 DIAGNOSIS — R002 Palpitations: Secondary | ICD-10-CM

## 2019-02-17 DIAGNOSIS — R42 Dizziness and giddiness: Secondary | ICD-10-CM

## 2019-02-17 DIAGNOSIS — E7849 Other hyperlipidemia: Secondary | ICD-10-CM | POA: Diagnosis not present

## 2019-02-17 DIAGNOSIS — Z8249 Family history of ischemic heart disease and other diseases of the circulatory system: Secondary | ICD-10-CM

## 2019-02-17 NOTE — Patient Instructions (Signed)
Medication Instructions:  Your physician recommends that you continue on your current medications as directed. Please refer to the Current Medication list given to you today.  If you need a refill on your cardiac medications before your next appointment, please call your pharmacy.   Lab work: None Ordered  Testing/Procedures: None Ordered  Follow-Up: At [facility], you and your health needs are our priority.  As part of our continuing mission to provide you with exceptional heart care, we have created designated Provider Care Teams.  These Care Teams include your primary Cardiologist (physician) and Advanced Practice Providers (APPs -  Physician Assistants and Nurse Practitioners) who all work together to provide you with the care you need, when you need it. You will need a follow up appointment in 1 years.  Please call our office 2 months in advance to schedule this appointment.  You may see Ena Dawley, MD or one of the following Advanced Practice Providers on your designated Care Team:   [place], PA-C Melina Copa, PA-C . Ermalinda Barrios, PA-C

## 2019-02-18 ENCOUNTER — Ambulatory Visit (INDEPENDENT_AMBULATORY_CARE_PROVIDER_SITE_OTHER)

## 2019-02-18 ENCOUNTER — Ambulatory Visit (INDEPENDENT_AMBULATORY_CARE_PROVIDER_SITE_OTHER): Admitting: Podiatry

## 2019-02-18 ENCOUNTER — Encounter: Payer: Self-pay | Admitting: Podiatry

## 2019-02-18 VITALS — BP 103/61 | HR 71

## 2019-02-18 DIAGNOSIS — M779 Enthesopathy, unspecified: Secondary | ICD-10-CM

## 2019-02-18 DIAGNOSIS — M21619 Bunion of unspecified foot: Secondary | ICD-10-CM | POA: Diagnosis not present

## 2019-02-18 DIAGNOSIS — M6740 Ganglion, unspecified site: Secondary | ICD-10-CM | POA: Diagnosis not present

## 2019-02-24 NOTE — Progress Notes (Signed)
Subjective:   Patient ID: Connie Soto, female   DOB: 63 y.o.   MRN: [id number]   HPI Patient presents with a painful cyst right third toe that has been growing and she had it drained one time but it returned.  Patient does not smoke likes to be active   Review of Systems  All other systems reviewed and are negative.       Objective:  Physical Exam Vitals signs and nursing note reviewed.  Constitutional:      Appearance: She is well-developed.  Pulmonary:     Effort: Pulmonary effort is normal.  Musculoskeletal: Normal range of motion.  Skin:    General: Skin is warm.  Neurological:     Mental Status: She is alert.     Neurovascular status intact muscle strength adequate range of motion within normal limits with patient found to have a large cyst at the distal phalangeal joint digit 3 right that is moderately painful when pressed.  Patient has good digital perfusion well oriented x3     Assessment:  Probability for mucoid cyst right third toe     Plan:  H&P reviewed condition and also possibility for long-term arthroplasty.  Today I did proximal nerve block sterile prep applied to the toe and using sterile instrumentation I aspirated the area getting out a gelatinous fluid by sterile dressing to compress and discussed possibility for arthroplasty in future  X-ray was negative for any kind of bony injury or arthritic calcification

## 2019-03-05 ENCOUNTER — Ambulatory Visit
Admission: RE | Admit: 2019-03-05 | Discharge: 2019-03-05 | Disposition: A | Discharge status: Home / Self Care | Source: Ambulatory Visit | Attending: Obstetrics & Gynecology | Admitting: Obstetrics & Gynecology

## 2019-03-05 ENCOUNTER — Other Ambulatory Visit: Payer: Self-pay

## 2019-03-05 DIAGNOSIS — Z1231 Encounter for screening mammogram for malignant neoplasm of breast: Secondary | ICD-10-CM

## 2019-04-02 ENCOUNTER — Other Ambulatory Visit: Payer: Self-pay | Admitting: Family Medicine

## 2019-04-02 DIAGNOSIS — E2839 Other primary ovarian failure: Secondary | ICD-10-CM

## 2019-04-07 ENCOUNTER — Ambulatory Visit
Admission: RE | Admit: 2019-04-07 | Discharge: 2019-04-07 | Disposition: A | Discharge status: Home / Self Care | Source: Ambulatory Visit | Attending: Family Medicine | Admitting: Family Medicine

## 2019-04-07 ENCOUNTER — Other Ambulatory Visit: Payer: Self-pay

## 2019-04-07 DIAGNOSIS — E2839 Other primary ovarian failure: Secondary | ICD-10-CM

## 2019-11-12 ENCOUNTER — Ambulatory Visit (INDEPENDENT_AMBULATORY_CARE_PROVIDER_SITE_OTHER): Admitting: Podiatry

## 2019-11-12 ENCOUNTER — Other Ambulatory Visit: Payer: Self-pay

## 2019-11-12 ENCOUNTER — Encounter: Payer: Self-pay | Admitting: Podiatry

## 2019-11-12 ENCOUNTER — Ambulatory Visit (INDEPENDENT_AMBULATORY_CARE_PROVIDER_SITE_OTHER)

## 2019-11-12 VITALS — Temp 97.6°F

## 2019-11-12 DIAGNOSIS — M722 Plantar fascial fibromatosis: Secondary | ICD-10-CM | POA: Diagnosis not present

## 2019-11-12 DIAGNOSIS — M6740 Ganglion, unspecified site: Secondary | ICD-10-CM | POA: Diagnosis not present

## 2019-11-12 NOTE — Patient Instructions (Signed)

## 2019-11-12 NOTE — Progress Notes (Signed)
Subjective:   Patient ID: Connie Soto, female   DOB: 64 y.o.   MRN: [id number]   HPI Patient presents with 2 problems with 1 being a cyst on the right third digit and the second being discomfort in the plantar heel left at the insertion of the tendon into the calcaneus.  Does not want surgery right but may need it someday and is tired of the heel pain left   ROS      Objective:  Physical Exam  Neurovascular status intact with a small cyst of the phalangeal joint distal right third toe that does become bothersome at times and is directly on the bone surface with acute discomfort of the plantar fascial left at the insertion calcaneus     Assessment:  Mucoid cyst right with bone involvement and acute plantar fasciitis left     Plan:  H&P reviewed conditions and for the right I did discuss possibility for arthroplasty with resection she may do it in future but not now number focusing on the left heel.  I did sterile prep injected the plantar fascial 3 mg Kenalog 5 mg Xylocaine and applied fascial brace to lift up the arch and I advised on shoe gear modifications and elevation of the arch and heel.  Reappoint to recheck  X-ray indicates there is small spur no indication of stress fracture arthritis

## 2020-02-29 NOTE — Progress Notes (Signed)
Cardiology Office Note    Date:  03/02/2020   ID:  Connie Soto, DOB 1955-11-05, MRN [id number]  PCP:  Shirlean Mylar, MD  Cardiologist: Tobias Alexander, MD EPS: None  Chief Complaint  Patient presents with  . Follow-up    History of Present Illness:  Connie Soto is a 64 y.o. female with history of palpitations, HLD-borderline diet-controlled and strong family history of early CAD.  Brother died suddenly at 48 of an MI.  2D echo in 2016 normal.   Last saw Dr. Delton See 08/2016 who ordered a GXT as well as calcium score to risk stratify and determine if she needs therapy with statins.  Calcium score was 0 red yeast rice 600 mg daily was recommended.  GXT normal 09/2016.   I last saw the patient 02/17/2019 at which time she was having some dizziness felt secondary to not drinking enough fluids while wearing a mask.  She was not orthostatic.  Patient comes in for yearly f/u. Overall doing well. No significant palpitations since staying hydrated. Walks daily 40 min and does light weights. No cardiac complaints.    Past Medical History:  Diagnosis Date  . History of palpitations   . Osteoporosis   . Valvular regurgitation     No past surgical history on file.  Current Medications: Current Meds  Medication Sig  . Calcium Carb-Cholecalciferol (CALCIUM + D3) 600-200 MG-UNIT TABS Take by mouth daily.  Marland Kitchen FORTEO 620 MCG/2.48ML SOPN Inject into the skin.  Marland Kitchen ibuprofen (ADVIL,MOTRIN) 200 MG tablet Take 200 mg by mouth every 6 (six) hours as needed for moderate pain.  . Multiple Vitamin (MULTIVITAMIN) tablet Take 1 tablet by mouth daily.  . Red Yeast Rice 600 MG CAPS Take 600 mg by mouth daily.  . SURE COMFORT PEN NEEDLES 31G X 5 MM MISC   . vitamin k 100 MCG tablet Take 100 mcg by mouth daily.     Allergies:   Neomycin   Social History   Socioeconomic History  . Marital status: Married    Spouse name: Not on file  . Number of children: Not on file  . Years  of education: Not on file  . Highest education level: Not on file  Occupational History  . Not on file  Tobacco Use  . Smoking status: Never Smoker  . Smokeless tobacco: Never Used  Vaping Use  . Vaping Use: Never used  Substance and Sexual Activity  . Alcohol use: Yes    Alcohol/week: 0.0 standard drinks  . Drug use: No  . Sexual activity: Not on file  Other Topics Concern  . Not on file  Social History Narrative  . Not on file   Social Determinants of Health   Financial Resource Strain:   . Difficulty of Paying Living Expenses: Not on file  Food Insecurity:   . Worried About Programme researcher, broadcasting/film/video in the Last Year: Not on file  . Ran Out of Food in the Last Year: Not on file  Transportation Needs:   . Lack of Transportation (Medical): Not on file  . Lack of Transportation (Non-Medical): Not on file  Physical Activity:   . Days of Exercise per Week: Not on file  . Minutes of Exercise per Session: Not on file  Stress:   . Feeling of Stress : Not on file  Social Connections:   . Frequency of Communication with Friends and Family: Not on file  . Frequency of Social Gatherings with Friends and Family: Not  on file  . Attends Religious Services: Not on file  . Active Member of Clubs or Organizations: Not on file  . Attends Banker Meetings: Not on file  . Marital Status: Not on file     Family History:  The patient's family history includes Breast cancer in her maternal grandmother and paternal grandmother; COPD in her father; Emphysema in her father; Healthy in her daughter, daughter, and son; Heart Problems in her father; Heart attack in her brother; Hypertension in her father; Scoliosis in her mother; Transient ischemic attack in her mother.   ROS:   Please see the history of present illness.    ROS All other systems reviewed and are negative.   PHYSICAL EXAM:   VS:  BP 110/60   Pulse 88   Ht 5' (1.524 m)   Wt 95 lb 6.4 oz (43.3 kg)   SpO2 97%   BMI  18.63 kg/m   Physical Exam  GEN: Thin, in no acute distress  Neck: no JVD, carotid bruits, or masses Cardiac:RRR; no murmurs, rubs, or gallops  Respiratory:  clear to auscultation bilaterally, normal work of breathing GI: soft, nontender, nondistended, + BS Ext: without cyanosis, clubbing, or edema, Good distal pulses bilaterally Neuro:  Alert and Oriented x 3 Psych: euthymic mood, full affect  Wt Readings from Last 3 Encounters:  03/02/20 95 lb 6.4 oz (43.3 kg)  02/17/19 95 lb (43.1 kg)  10/02/17 97 lb (44 kg)      Studies/Labs Reviewed:   EKG:  EKG is  ordered today.  The ekg ordered today demonstrates NSR with right axis  Recent Labs: No results found for requested labs within last 8760 hours.   Lipid Panel No results found for: CHOL, TRIG, HDL, CHOLHDL, VLDL, LDLCALC, LDLDIRECT  Additional studies/ records that were reviewed today include:   GXT 09/2016 Study Highlights       Blood pressure demonstrated a normal response to exercise.  There was no ST segment deviation noted during stress.  No T wave inversion was noted during stress.   Normal ECG stress test.      2D echo 2016Study Conclusions  - Left ventricle: The cavity size was normal. Wall thickness was   normal. Systolic function was normal. The estimated ejection   fraction was in the range of 50% to 55%. Wall motion was normal;   there were no regional wall motion abnormalities.   CT calcium score 3/2018IMPRESSION: Coronary calcium score of 0. This was 0 percentile for age and sex matched control.   Tobias Alexander     Electronically Signed   By: Tobias Alexander   On: 08/27/2016 10:46      ASSESSMENT:    1. Family history of early CAD   2. Hyperlipidemia, unspecified hyperlipidemia type   3. Intermittent palpitations      PLAN:  In order of problems listed above:  Family history of early CAD calcium score 0 on coronary CT 2018-no symptoms, exercising regularly. F/u yearly or  prn  Hyperlipidemia on red yeast rice to get labs by PCP in Nov  History of palpitations very rare since she is staying hydrated.    Medication Adjustments/Labs and Tests Ordered: Current medicines are reviewed at length with the patient today.  Concerns regarding medicines are outlined above.  Medication changes, Labs and Tests ordered today are listed in the Patient Instructions below. There are no Patient Instructions on file for this visit.   Elson Clan, PA-C  03/02/2020 11:07  AM    [facility] [place], [place], [place]  [address] Phone: [phone]; Fax: [phone]

## 2020-03-02 ENCOUNTER — Encounter: Payer: Self-pay | Admitting: Physician Assistant

## 2020-03-02 ENCOUNTER — Other Ambulatory Visit: Payer: Self-pay

## 2020-03-02 ENCOUNTER — Ambulatory Visit (INDEPENDENT_AMBULATORY_CARE_PROVIDER_SITE_OTHER): Admitting: Physician Assistant

## 2020-03-02 VITALS — BP 110/60 | HR 88 | Ht 60.0 in | Wt 95.4 lb

## 2020-03-02 DIAGNOSIS — R002 Palpitations: Secondary | ICD-10-CM | POA: Diagnosis not present

## 2020-03-02 DIAGNOSIS — E785 Hyperlipidemia, unspecified: Secondary | ICD-10-CM

## 2020-03-02 DIAGNOSIS — Z8249 Family history of ischemic heart disease and other diseases of the circulatory system: Secondary | ICD-10-CM | POA: Diagnosis not present

## 2020-03-02 NOTE — Patient Instructions (Signed)
Medication Instructions:  Your physician recommends that you continue on your current medications as directed. Please refer to the Current Medication list given to you today.  *If you need a refill on your cardiac medications before your next appointment, please call your pharmacy*   Lab Work: None today If you have labs (blood work) drawn today and your tests are completely normal, you will receive your results only by: Marland Kitchen MyChart Message (if you have MyChart) OR . A paper copy in the mail If you have any lab test that is abnormal or we need to change your treatment, we will call you to review the results.   Testing/Procedures: None today   Follow-Up: At [facility], you and your health needs are our priority.  As part of our continuing mission to provide you with exceptional heart care, we have created designated Provider Care Teams.  These Care Teams include your primary Cardiologist (physician) and Advanced Practice Providers (APPs -  Physician Assistants and Nurse Practitioners) who all work together to provide you with the care you need, when you need it.  We recommend signing up for the patient portal called "MyChart".  Sign up information is provided on this After Visit Summary.  MyChart is used to connect with patients for Virtual Visits (Telemedicine).  Patients are able to view lab/test results, encounter notes, upcoming appointments, etc.  Non-urgent messages can be sent to your provider as well.   To learn more about what you can do with MyChart, go to [email or web address].    Your next appointment:   1 year(s)  The format for your next appointment:   In Person  Provider:   You may see Tobias Alexander, MD or one of the following Advanced Practice Providers on your designated Care Team:    Ronie Spies, PA-C  Jacolyn Reedy, PA-C    Other Instructions None today

## 2020-03-08 ENCOUNTER — Other Ambulatory Visit: Payer: Self-pay | Admitting: Obstetrics and Gynecology

## 2020-03-08 DIAGNOSIS — Z1231 Encounter for screening mammogram for malignant neoplasm of breast: Secondary | ICD-10-CM

## 2020-03-25 ENCOUNTER — Ambulatory Visit

## 2020-03-30 ENCOUNTER — Other Ambulatory Visit: Payer: Self-pay

## 2020-03-30 ENCOUNTER — Ambulatory Visit
Admission: RE | Admit: 2020-03-30 | Discharge: 2020-03-30 | Disposition: A | Discharge status: Home / Self Care | Source: Ambulatory Visit | Attending: Obstetrics and Gynecology | Admitting: Obstetrics and Gynecology

## 2020-03-30 DIAGNOSIS — Z1231 Encounter for screening mammogram for malignant neoplasm of breast: Secondary | ICD-10-CM

## 2020-06-08 ENCOUNTER — Other Ambulatory Visit: Payer: Self-pay | Admitting: Sports Medicine

## 2020-06-08 DIAGNOSIS — M810 Age-related osteoporosis without current pathological fracture: Secondary | ICD-10-CM

## 2020-08-31 ENCOUNTER — Ambulatory Visit (INDEPENDENT_AMBULATORY_CARE_PROVIDER_SITE_OTHER)

## 2020-08-31 ENCOUNTER — Other Ambulatory Visit: Payer: Self-pay

## 2020-08-31 ENCOUNTER — Ambulatory Visit (INDEPENDENT_AMBULATORY_CARE_PROVIDER_SITE_OTHER): Admitting: Podiatry

## 2020-08-31 ENCOUNTER — Encounter: Payer: Self-pay | Admitting: Podiatry

## 2020-08-31 DIAGNOSIS — M722 Plantar fascial fibromatosis: Secondary | ICD-10-CM

## 2020-08-31 NOTE — Progress Notes (Signed)
Subjective:   Patient ID: Connie Soto, female   DOB: 65 y.o.   MRN: [id number]   HPI Patient presents stating that she is had continued pain in the left plantar heel and states that every time she gets active it seems to bother her again.  States that she does get relief with the injections but she feels like she needs something more to prevent this from continuing to recur   ROS      Objective:  Physical Exam  Neurovascular status intact with quite a bit of inflammation plantar heel left at the insertional point tendon calcaneus with fluid buildup noted around the medial band with pain and moderate depression of the arch      Assessment:  Acute plantar fasciitis left with inflammation fluid medial band     Plan:  H&P reviewed condition went ahead today did sterile prep and reinjected the fascia 3 mg Kenalog 5 mg Xylocaine discussed the importance of stretching and dispensed a plantar fascial night splint to stretch out the arch along with ice therapy.  Also casted for functional orthotics to try to reduce the stress on feet in general  X-rays were negative for signs that there is an increase in spur size with small spur noted no indications of stress fracture arthritis

## 2020-10-13 ENCOUNTER — Ambulatory Visit (INDEPENDENT_AMBULATORY_CARE_PROVIDER_SITE_OTHER): Admitting: *Deleted

## 2020-10-13 ENCOUNTER — Other Ambulatory Visit: Payer: Self-pay

## 2020-10-13 DIAGNOSIS — M722 Plantar fascial fibromatosis: Secondary | ICD-10-CM

## 2020-10-13 NOTE — Progress Notes (Signed)
Patient presents today to pick up custom molded foot orthotics, diagnosed with plantar fasciitis by Dr. Charlsie Merles.   Orthotics were dispensed and fit was satisfactory. Reviewed instructions for break-in and wear. Written instructions given to patient.  Patient will follow up as needed.   [facility] - order # [id number]

## 2021-02-24 ENCOUNTER — Other Ambulatory Visit: Payer: Self-pay | Admitting: Obstetrics and Gynecology

## 2021-04-07 ENCOUNTER — Other Ambulatory Visit

## 2021-04-11 ENCOUNTER — Ambulatory Visit
Admission: RE | Admit: 2021-04-11 | Discharge: 2021-04-11 | Disposition: A | Discharge status: Home / Self Care | Source: Ambulatory Visit | Attending: Sports Medicine | Admitting: Sports Medicine

## 2021-04-11 DIAGNOSIS — M810 Age-related osteoporosis without current pathological fracture: Secondary | ICD-10-CM

## 2021-06-29 ENCOUNTER — Other Ambulatory Visit: Payer: Self-pay | Admitting: Obstetrics and Gynecology

## 2021-06-29 DIAGNOSIS — Z1231 Encounter for screening mammogram for malignant neoplasm of breast: Secondary | ICD-10-CM

## 2021-07-17 ENCOUNTER — Ambulatory Visit
Admission: RE | Admit: 2021-07-17 | Discharge: 2021-07-17 | Disposition: A | Discharge status: Home / Self Care | Payer: TRICARE For Life (TFL) | Source: Ambulatory Visit | Attending: Obstetrics and Gynecology | Admitting: Obstetrics and Gynecology

## 2021-07-17 DIAGNOSIS — Z1231 Encounter for screening mammogram for malignant neoplasm of breast: Secondary | ICD-10-CM

## 2021-12-28 ENCOUNTER — Other Ambulatory Visit: Payer: Self-pay | Admitting: Family Medicine

## 2021-12-28 ENCOUNTER — Ambulatory Visit
Admission: RE | Admit: 2021-12-28 | Discharge: 2021-12-28 | Disposition: A | Discharge status: Home / Self Care | Payer: TRICARE For Life (TFL) | Source: Ambulatory Visit | Attending: Family Medicine | Admitting: Family Medicine

## 2021-12-28 DIAGNOSIS — R052 Subacute cough: Secondary | ICD-10-CM

## 2022-01-24 NOTE — Progress Notes (Signed)
Cardiology Office Note    Date:  02/07/2022   ID:  Connie Soto, DOB 12-24-1955, MRN [id number]   PCP:  Camie Patience, FNP   [facility]  Cardiologist:  Meriam Sprague, MD   Advanced Practice Provider:  Dyann Kief, PA-C Electrophysiologist:  None   [id number]}   No chief complaint on file.   History of Present Illness:  Connie Soto is a 66 y.o. female  with history of palpitations, HLD-borderline diet-controlled and strong family history of early CAD.  Brother died suddenly at 57 of an MI.  2D echo in 2016 normal.   Last saw Dr. Delton See 08/2016 who ordered a GXT as well as calcium score to risk stratify and determine if she needs therapy with statins.  Calcium score was 0 red yeast rice 600 mg daily was recommended.  GXT normal 09/2016.   I last saw the patient 02/2020 and she was doing well.  Patient comes in for f/u. This summer she had a URI for 2 months that seemed to be more asthma and was using an inhaler. She noticed some DOE and chest tightness during this time and inhaler relieved it. She is now walking again and not noticing any DOE. Walking 30 min a day without a problem. Not lifting weights.  Occasional skipped beats but very rare.    Past Medical History:  Diagnosis Date   History of palpitations    Osteoporosis    Valvular regurgitation     No past surgical history on file.  Current Medications: Current Meds  Medication Sig   Calcium Carb-Cholecalciferol (CALCIUM + D3) 600-200 MG-UNIT TABS Take by mouth daily.   ibuprofen (ADVIL,MOTRIN) 200 MG tablet Take 200 mg by mouth every 6 (six) hours as needed for moderate pain.   Multiple Vitamin (MULTIVITAMIN) tablet Take 1 tablet by mouth daily.   PROLIA 60 MG/ML SOSY injection Inject 60 mg into the skin every 6 (six) months.   Red Yeast Rice 600 MG CAPS Take 600 mg by mouth daily.   vitamin k 100 MCG tablet Take 100 mcg by mouth daily.     Allergies:    Patient has no active allergies.   Social History   Socioeconomic History   Marital status: Married    Spouse name: Not on file   Number of children: Not on file   Years of education: Not on file   Highest education level: Not on file  Occupational History   Not on file  Tobacco Use   Smoking status: Never   Smokeless tobacco: Never  Vaping Use   Vaping Use: Never used  Substance and Sexual Activity   Alcohol use: Yes    Alcohol/week: 0.0 standard drinks of alcohol   Drug use: No   Sexual activity: Not on file  Other Topics Concern   Not on file  Social History Narrative   Not on file   Social Determinants of Health   Financial Resource Strain: Not on file  Food Insecurity: Not on file  Transportation Needs: Not on file  Physical Activity: Not on file  Stress: Not on file  Social Connections: Not on file     Family History:  The patient's  family history includes Breast cancer in her maternal grandmother and paternal grandmother; COPD in her father; Emphysema in her father; Healthy in her daughter, daughter, and son; Heart Problems in her father; Heart attack in her brother; Hypertension in her father; Scoliosis in  her mother; Transient ischemic attack in her mother.   ROS:   Please see the history of present illness.    ROS All other systems reviewed and are negative.   PHYSICAL EXAM:   VS:  BP 110/60 (BP Location: Left Arm, Patient Position: Sitting, Cuff Size: Normal)   Pulse 83   Ht 5' (1.524 m)   Wt 93 lb 12.8 oz (42.5 kg)   SpO2 95%   BMI 18.32 kg/m   Physical Exam  GEN: Thin, in no acute distress  Neck: no JVD, carotid bruits, or masses Cardiac:RRR; no murmurs, rubs, or gallops  Respiratory:  clear to auscultation bilaterally, normal work of breathing GI: soft, nontender, nondistended, + BS Ext: without cyanosis, clubbing, or edema, Good distal pulses bilaterally Neuro:  Alert and Oriented x 3,  Psych: euthymic mood, full affect  Wt Readings from  Last 3 Encounters:  02/07/22 93 lb 12.8 oz (42.5 kg)  03/02/20 95 lb 6.4 oz (43.3 kg)  02/17/19 95 lb (43.1 kg)      Studies/Labs Reviewed:   EKG:  EKG is  ordered today.  The ekg ordered today demonstrates NSR, normal EKG  Recent Labs: No results found for requested labs within last 365 days.   Lipid Panel No results found for: "CHOL", "TRIG", "HDL", "CHOLHDL", "VLDL", "LDLCALC", "LDLDIRECT"  Additional studies/ records that were reviewed today include:  GXT 09/2016 Study Highlights      Blood pressure demonstrated a normal response to exercise. There was no ST segment deviation noted during stress. No T wave inversion was noted during stress.   Normal ECG stress test.      2D echo 2016Study Conclusions  - Left ventricle: The cavity size was normal. Wall thickness was   normal. Systolic function was normal. The estimated ejection   fraction was in the range of 50% to 55%. Wall motion was normal;   there were no regional wall motion abnormalities.   CT calcium score 3/2018IMPRESSION: Coronary calcium score of 0. This was 0 percentile for age and sex matched control.   Connie Soto     Electronically Signed   By: Connie Soto   On: 08/27/2016 10:46       Risk Assessment/Calculations:         ASSESSMENT:    1. DOE (dyspnea on exertion)   2. Family history of early CAD   3. Hyperlipidemia, unspecified hyperlipidemia type   4. Intermittent palpitations      PLAN:  In order of problems listed above:  DOE in the setting of URI/possible asthma improved with inhalers and no recurrence. She'll let us know if it returns.  Family history of early CAD calcium score 0 on coronary CT 2018-no symptoms, exercising regularly.    Hyperlipidemia on red yeast rice  but going to try psyllium.  LDL 111 last year and to have labs drawn soon.   History of palpitations very rare since she is staying hydrated.    Shared Decision Making/Informed Consent         Medication Adjustments/Labs and Tests Ordered: Current medicines are reviewed at length with the patient today.  Concerns regarding medicines are outlined above.  Medication changes, Labs and Tests ordered today are listed in the Patient Instructions below. Patient Instructions  Medication Instructions:  Your physician recommends that you continue on your current medications as directed. Please refer to the Current Medication list given to you today.  *If you need a refill on your cardiac medications before your next  appointment, please call your pharmacy*   Lab Work: None If you have labs (blood work) drawn today and your tests are completely normal, you will receive your results only by: MyChart Message (if you have MyChart) OR A paper copy in the mail If you have any lab test that is abnormal or we need to change your treatment, we will call you to review the results.   Follow-Up: At [facility], you and your health needs are our priority.  As part of our continuing mission to provide you with exceptional heart care, we have created designated Provider Care Teams.  These Care Teams include your primary Cardiologist (physician) and Advanced Practice Providers (APPs -  Physician Assistants and Nurse Practitioners) who all work together to provide you with the care you need, when you need it.   Your next appointment:   1 year(s)  The format for your next appointment:   In Person  Provider:   Jacolyn Reedy, PA-C     Signed, Jacolyn Reedy, PA-C  02/07/2022 10:00 AM    [facility] [address] [place], [place], [place]  [address] Phone: [phone]; Fax: [phone]

## 2022-02-07 ENCOUNTER — Ambulatory Visit: Payer: Medicare PPO | Attending: Physician Assistant | Admitting: Physician Assistant

## 2022-02-07 ENCOUNTER — Encounter: Payer: Self-pay | Admitting: Physician Assistant

## 2022-02-07 VITALS — BP 110/60 | HR 83 | Ht 60.0 in | Wt 93.8 lb

## 2022-02-07 DIAGNOSIS — E785 Hyperlipidemia, unspecified: Secondary | ICD-10-CM

## 2022-02-07 DIAGNOSIS — R002 Palpitations: Secondary | ICD-10-CM | POA: Diagnosis not present

## 2022-02-07 DIAGNOSIS — R0609 Other forms of dyspnea: Secondary | ICD-10-CM

## 2022-02-07 DIAGNOSIS — Z8249 Family history of ischemic heart disease and other diseases of the circulatory system: Secondary | ICD-10-CM

## 2022-02-07 NOTE — Patient Instructions (Signed)
Medication Instructions:  Your physician recommends that you continue on your current medications as directed. Please refer to the Current Medication list given to you today.  *If you need a refill on your cardiac medications before your next appointment, please call your pharmacy*   Lab Work: None If you have labs (blood work) drawn today and your tests are completely normal, you will receive your results only by: MyChart Message (if you have MyChart) OR A paper copy in the mail If you have any lab test that is abnormal or we need to change your treatment, we will call you to review the results.   Follow-Up: At [facility], you and your health needs are our priority.  As part of our continuing mission to provide you with exceptional heart care, we have created designated Provider Care Teams.  These Care Teams include your primary Cardiologist (physician) and Advanced Practice Providers (APPs -  Physician Assistants and Nurse Practitioners) who all work together to provide you with the care you need, when you need it.   Your next appointment:   1 year(s)  The format for your next appointment:   In Person  Provider:   Jacolyn Reedy, PA-C

## 2022-02-13 DIAGNOSIS — E559 Vitamin D deficiency, unspecified: Secondary | ICD-10-CM | POA: Diagnosis not present

## 2022-02-13 DIAGNOSIS — R5383 Other fatigue: Secondary | ICD-10-CM | POA: Diagnosis not present

## 2022-02-13 DIAGNOSIS — M810 Age-related osteoporosis without current pathological fracture: Secondary | ICD-10-CM | POA: Diagnosis not present

## 2022-02-15 ENCOUNTER — Other Ambulatory Visit: Payer: Self-pay

## 2022-02-15 ENCOUNTER — Ambulatory Visit (INDEPENDENT_AMBULATORY_CARE_PROVIDER_SITE_OTHER): Payer: Medicare PPO | Admitting: Allergy

## 2022-02-15 ENCOUNTER — Encounter: Payer: Self-pay | Admitting: Allergy

## 2022-02-15 VITALS — BP 100/58 | HR 97 | Temp 97.8°F | Resp 12 | Ht 60.1 in | Wt 92.6 lb

## 2022-02-15 DIAGNOSIS — J3089 Other allergic rhinitis: Secondary | ICD-10-CM | POA: Diagnosis not present

## 2022-02-15 DIAGNOSIS — J4520 Mild intermittent asthma, uncomplicated: Secondary | ICD-10-CM

## 2022-02-15 MED ORDER — ALBUTEROL SULFATE HFA 108 (90 BASE) MCG/ACT IN AERS: 2.0000 | INHALATION_SPRAY | RESPIRATORY_TRACT | 1 refills | Status: AC | PRN

## 2022-02-15 MED ORDER — FLOVENT HFA 110 MCG/ACT IN AERO: 2.0000 | INHALATION_SPRAY | Freq: Two times a day (BID) | RESPIRATORY_TRACT | 5 refills | Status: AC

## 2022-02-15 MED ORDER — FLUTICASONE PROPIONATE HFA 110 MCG/ACT IN AERO: 2.0000 | INHALATION_SPRAY | Freq: Two times a day (BID) | RESPIRATORY_TRACT | 5 refills | Status: DC

## 2022-02-15 NOTE — Progress Notes (Signed)
New Patient Note  RE: Connie Soto MRN: [id number] DOB: December 28, 1955 Date of Office Visit: 02/15/2022 Primary care provider: Camie Patience, FNP  Chief Complaint: cough  History of present illness: Connie Soto is a 66 y.o. female presenting today for evaluation of cough.    She states she had been having a cough.  She states currently she is better now.  She wants to know what she should do in the future if it comes back.    She states she had a cough all summer starting in early June.  She did go to a walk-in clinic and was told her lungs were clear.  She states she has had chest tightness with the cough.  She was prescribed albuterol inhaler, cough medication and a nasal spray.  The inhaler did help.  She could not tolerate the cough medication and nasal spray (had a prescription and states on the package could cause dizziness).  She does report being sensitive to a lot of medication.   She went back to UC a month later with continued cough.  At some point she did have a CXR that was clear. She states then started to develop low grade fever with the cough and she saw her PCP who advised to continue with inhaler use.  2 weeks later she was still coughing with low grade fever.  Her PCP did prescribe augmentin.  The antibiotic did help.  She states the augmentin did cause GI issues however and also changed the color of her urine.  She states her Covid testing was negative.  She states it seems like so many things was causing cough.  She noted dairy could worsen the cough and mucus.  She recalls eating a chocalote chip cookie that seem to trigger more coughing.  No wheezing or shortness of breath.  During this summer with the cough she was not going to gym.  No concern for exercise intolerance.    While living in [place] during the fall she states she would have a cough and was told she could have fall allergies.  She has lived in the [place] for past 8 years.    She does  report throat clearing and nasal congestion/drainage.  She does report sneezing fits.   Has not needed to take antihistamines for the symptoms.     Review of systems: Review of Systems  Constitutional: Negative.   HENT: Negative.    Eyes: Negative.   Respiratory:  Positive for cough.   Cardiovascular: Negative.   Gastrointestinal: Negative.   Musculoskeletal: Negative.   Skin: Negative.   Allergic/Immunologic: Negative.   Neurological: Negative.     All other systems negative unless noted above in HPI  Past medical history: Past Medical History:  Diagnosis Date   History of palpitations    Osteoporosis    Valvular regurgitation     Past surgical history: History reviewed. No pertinent surgical history.  Family history:  Family History  Problem Relation Age of Onset   Transient ischemic attack Mother    Scoliosis Mother    Emphysema Father    Heart Problems Father        heart valve problems   COPD Father    Hypertension Father    Breast cancer Maternal Grandmother    Breast cancer Paternal Grandmother    Heart attack Brother    Healthy Son    Healthy Daughter    Healthy Daughter     Social history: Lives in a  home without carpeting with gas heating and central cooling.  No pets in the home.  There is no concern for water damage, mildew or roaches in the home.  She is retired.  She denies a smoking history.   Medication List: Current Outpatient Medications  Medication Sig Dispense Refill   Calcium Carb-Cholecalciferol (CALCIUM + D3) 600-200 MG-UNIT TABS Take by mouth daily.     ibuprofen (ADVIL,MOTRIN) 200 MG tablet Take 200 mg by mouth every 6 (six) hours as needed for moderate pain.     Multiple Vitamin (MULTIVITAMIN) tablet Take 1 tablet by mouth daily.     PROLIA 60 MG/ML SOSY injection Inject 60 mg into the skin every 6 (six) months.     Red Yeast Rice 600 MG CAPS Take 600 mg by mouth daily.     vitamin k 100 MCG tablet Take 100 mcg by mouth daily.      No current facility-administered medications for this visit.    Known medication allergies: No Active Allergies   Physical examination: Blood pressure (!) 100/58, pulse 97, temperature 97.8 F (36.6 C), temperature source Temporal, resp. rate 12, height 5' 0.1" (1.527 m), weight 92 lb 9.6 oz (42 kg), SpO2 97 %.  General: Alert, interactive, in no acute distress. HEENT: PERRLA, TMs pearly gray, turbinates non-edematous without discharge, post-pharynx non erythematous. Neck: Supple without lymphadenopathy. Lungs: Clear to auscultation without wheezing, rhonchi or rales. {no increased work of breathing. CV: Normal S1, S2 without murmurs. Abdomen: Nondistended, nontender. Skin: Warm and dry, without lesions or rashes. Extremities:  No clubbing, cyanosis or edema. Neuro:   Grossly intact.  Diagnositics/Labs:  Spirometry: FEV1: 1.98L 97%, FVC: 2.58L 100%, ratio consistent with obstructive pattern  Allergy testing:   Airborne Adult Perc - 02/15/22 0914     Time Antigen Placed 2376    Allergen Manufacturer Waynette Buttery    Location Back    Number of Test 59    1. Control-Buffer 50% Glycerol Negative    2. Control-Histamine 1 mg/ml 2+    3. Albumin saline Negative    4. Bahia Negative    5. [place] 2+    6. Johnson Negative    7. [place] Blue Negative    8. Meadow Fescue Negative    9. Perennial Rye Negative    10. Sweet Vernal Negative    11. Timothy Negative    12. Cocklebur Negative    13. Burweed Marshelder Negative    14. Ragweed, short Negative    15. Ragweed, Giant Negative    16. Plantain,  English Negative    17. Lamb's Quarters Negative    18. Sheep Sorrell Negative    19. Rough Pigweed Negative    20. Marsh Elder, Rough Negative    21. Mugwort, Common Negative    22. Ash mix Negative    23. Birch mix Negative    24. Beech American Negative    25. Box, Elder Negative    26. Cedar, red Negative    27. Cottonwood, Guinea-Bissau Negative    28. Elm mix Negative    29.  Hickory Negative    30. Maple mix Negative    31. Oak, [place] mix Negative    32. Pecan Pollen Negative    33. Pine mix Negative    34. Sycamore Eastern Negative    35. Walnut, Black Pollen Negative    36. Alternaria alternata Negative    37. Cladosporium Herbarum Negative    38. Aspergillus mix Negative    39. Penicillium  mix Negative    40. Bipolaris sorokiniana (Helminthosporium) Negative    41. Drechslera spicifera (Curvularia) Negative    42. Mucor plumbeus Negative    43. Fusarium moniliforme Negative    44. Aureobasidium pullulans (pullulara) Negative    45. Rhizopus oryzae Negative    46. Botrytis cinera Negative    47. Epicoccum nigrum Negative    48. Phoma betae Negative    49. Candida Albicans Negative    50. Trichophyton mentagrophytes Negative    51. Mite, D Farinae  5,000 AU/ml Negative    52. Mite, D Pteronyssinus  5,000 AU/ml 2+    53. Cat Hair 10,000 BAU/ml Negative    54.  Dog Epithelia Negative    55. Mixed Feathers Negative    56. Horse Epithelia Negative    57. Cockroach, German 2+    58. Mouse Negative    59. Tobacco Leaf Negative             Allergy testing results were read and interpreted by provider, documented by clinical staff.   Assessment and plan:   Cough -reactive airway Allergic rhinitis - throat clearing, nasal symptoms, sneezing  -Testing today showed: grasses, dust mites, and cockroach. - Copy of test results provided.  - Avoidance measures provided. - For allergy symptom control can take long-acting antihistamine like Zyrtec, Xyzal or Allegra as needed - You can use an extra dose of the antihistamine, if needed, for breakthrough symptoms.  - Consider nasal saline rinses 1-2 times daily to remove allergens from the nasal cavities as well as help with mucous clearance (this is especially helpful to do before the nasal sprays are given)  - Lung function testing is normal! - Have access to albuterol inhaler 2 puffs every 4-6 hours  as needed for cough/wheeze/shortness of breath/chest tightness.  May use 15-20 minutes prior to activity.   Monitor frequency of use.   - If you have respiratory illness or any trigger of cough then start controller inhaler medication, Flovent 2 puffs twice a day, until symptoms have resolved  Breathing control goals:  Full participation in all desired activities (may need albuterol before activity) Albuterol use two time or less a week on average (not counting use with activity) Cough interfering with sleep two time or less a month Oral steroids no more than once a year No hospitalizations   Follow-up in 4-6 months or sooner if needed  I appreciate the opportunity to take part in Connie Soto's care. Please do not hesitate to contact me with questions.  Sincerely,   Margo Aye, MD Allergy/Immunology [facility]

## 2022-02-15 NOTE — Patient Instructions (Signed)
Cough -reactive airway Throat clearing, nasal symptoms, sneezing   -Testing today showed: grasses, dust mites, and cockroach. - Copy of test results provided.  - Avoidance measures provided. - For allergy symptom control can take long-acting antihistamine like Zyrtec, Xyzal or Allegra as needed - You can use an extra dose of the antihistamine, if needed, for breakthrough symptoms.  - Consider nasal saline rinses 1-2 times daily to remove allergens from the nasal cavities as well as help with mucous clearance (this is especially helpful to do before the nasal sprays are given)  - Lung function testing is normal! - Have access to albuterol inhaler 2 puffs every 4-6 hours as needed for cough/wheeze/shortness of breath/chest tightness.  May use 15-20 minutes prior to activity.   Monitor frequency of use.   - If you have respiratory illness or any trigger of cough then start controller inhaler medication, Flovent 2 puffs twice a day, until symptoms have resolved  Breathing control goals:  Full participation in all desired activities (may need albuterol before activity) Albuterol use two time or less a week on average (not counting use with activity) Cough interfering with sleep two time or less a month Oral steroids no more than once a year No hospitalizations   Follow-up in 4-6 months or sooner if needed

## 2022-02-20 DIAGNOSIS — M810 Age-related osteoporosis without current pathological fracture: Secondary | ICD-10-CM | POA: Diagnosis not present

## 2022-02-20 DIAGNOSIS — E559 Vitamin D deficiency, unspecified: Secondary | ICD-10-CM | POA: Diagnosis not present

## 2022-03-06 DIAGNOSIS — Z136 Encounter for screening for cardiovascular disorders: Secondary | ICD-10-CM | POA: Diagnosis not present

## 2022-03-06 DIAGNOSIS — M810 Age-related osteoporosis without current pathological fracture: Secondary | ICD-10-CM | POA: Diagnosis not present

## 2022-03-06 DIAGNOSIS — Z23 Encounter for immunization: Secondary | ICD-10-CM | POA: Diagnosis not present

## 2022-03-06 DIAGNOSIS — Z0000 Encounter for general adult medical examination without abnormal findings: Secondary | ICD-10-CM | POA: Diagnosis not present

## 2022-03-06 DIAGNOSIS — L989 Disorder of the skin and subcutaneous tissue, unspecified: Secondary | ICD-10-CM | POA: Diagnosis not present

## 2022-03-06 DIAGNOSIS — R7309 Other abnormal glucose: Secondary | ICD-10-CM | POA: Diagnosis not present

## 2022-04-17 DIAGNOSIS — M79662 Pain in left lower leg: Secondary | ICD-10-CM | POA: Diagnosis not present

## 2022-05-02 DIAGNOSIS — M810 Age-related osteoporosis without current pathological fracture: Secondary | ICD-10-CM | POA: Diagnosis not present

## 2022-05-02 DIAGNOSIS — M79662 Pain in left lower leg: Secondary | ICD-10-CM | POA: Diagnosis not present

## 2022-05-05 DIAGNOSIS — M79662 Pain in left lower leg: Secondary | ICD-10-CM | POA: Diagnosis not present

## 2022-05-08 DIAGNOSIS — M79662 Pain in left lower leg: Secondary | ICD-10-CM | POA: Diagnosis not present

## 2022-05-08 DIAGNOSIS — M810 Age-related osteoporosis without current pathological fracture: Secondary | ICD-10-CM | POA: Diagnosis not present

## 2022-05-16 ENCOUNTER — Other Ambulatory Visit: Payer: Self-pay | Admitting: Sports Medicine

## 2022-05-16 DIAGNOSIS — M810 Age-related osteoporosis without current pathological fracture: Secondary | ICD-10-CM

## 2022-07-05 ENCOUNTER — Ambulatory Visit: Admitting: Allergy

## 2022-07-17 DIAGNOSIS — R5383 Other fatigue: Secondary | ICD-10-CM | POA: Diagnosis not present

## 2022-07-17 DIAGNOSIS — M810 Age-related osteoporosis without current pathological fracture: Secondary | ICD-10-CM | POA: Diagnosis not present

## 2022-07-19 ENCOUNTER — Other Ambulatory Visit: Payer: Self-pay

## 2022-07-19 DIAGNOSIS — M810 Age-related osteoporosis without current pathological fracture: Secondary | ICD-10-CM

## 2022-07-23 ENCOUNTER — Telehealth: Payer: Self-pay | Admitting: Pharmacy Technician

## 2022-07-23 NOTE — Telephone Encounter (Signed)
Auth Submission: NO AUTH NEEDED Payer: [place] Medication & CPT/J Code(s) submitted: Reclast (Zolendronic acid) [id number] Route of submission (phone, fax, portal):  Phone # Fax # Auth type: Buy/Bill Units/visits requested: X1 Reference number:  Approval from: 07/23/22 to 06/04/23

## 2022-08-02 DIAGNOSIS — M7041 Prepatellar bursitis, right knee: Secondary | ICD-10-CM | POA: Diagnosis not present

## 2022-08-03 DIAGNOSIS — M7041 Prepatellar bursitis, right knee: Secondary | ICD-10-CM | POA: Diagnosis not present

## 2022-08-06 ENCOUNTER — Ambulatory Visit: Payer: Medicare PPO

## 2022-08-09 ENCOUNTER — Ambulatory Visit (INDEPENDENT_AMBULATORY_CARE_PROVIDER_SITE_OTHER): Payer: Medicare PPO

## 2022-08-09 VITALS — BP 101/65 | HR 69 | Temp 98.0°F | Resp 18 | Ht 60.0 in | Wt 94.8 lb

## 2022-08-09 DIAGNOSIS — M810 Age-related osteoporosis without current pathological fracture: Secondary | ICD-10-CM

## 2022-08-09 MED ORDER — SODIUM CHLORIDE 0.9 % IV SOLN: INTRAVENOUS | Status: DC

## 2022-08-09 MED ORDER — ACETAMINOPHEN 325 MG PO TABS
650.0000 mg | ORAL_TABLET | Freq: Once | ORAL | Status: AC
  Administered 2022-08-09: 650 mg via ORAL
  Filled 2022-08-09: qty 2

## 2022-08-09 MED ORDER — ZOLEDRONIC ACID 5 MG/100ML IV SOLN
5.0000 mg | Freq: Once | INTRAVENOUS | Status: AC
  Administered 2022-08-09: 5 mg via INTRAVENOUS
  Filled 2022-08-09: qty 100

## 2022-08-09 MED ORDER — DIPHENHYDRAMINE HCL 25 MG PO CAPS
25.0000 mg | ORAL_CAPSULE | Freq: Once | ORAL | Status: AC
  Administered 2022-08-09: 25 mg via ORAL
  Filled 2022-08-09: qty 1

## 2022-08-09 NOTE — Patient Instructions (Signed)

## 2022-08-09 NOTE — Progress Notes (Signed)
Diagnosis: Osteoporosis  Provider:  Marshell Garfinkel MD  Procedure: Infusion  IV Type: Peripheral, IV Location: L Antecubital  Reclast (Zolendronic Acid), Dose: 5 mg  Infusion Start Time: [id number]  Infusion Stop Time: [id number]  Post Infusion IV Care: Observation period completed and Peripheral IV Discontinued  Discharge: Condition: Good, Destination: Home . AVS Provided  Performed by:  Arnoldo Morale, RN

## 2022-08-14 DIAGNOSIS — M25561 Pain in right knee: Secondary | ICD-10-CM | POA: Diagnosis not present

## 2022-08-29 ENCOUNTER — Other Ambulatory Visit: Payer: Self-pay | Admitting: Obstetrics and Gynecology

## 2022-08-29 DIAGNOSIS — Z1231 Encounter for screening mammogram for malignant neoplasm of breast: Secondary | ICD-10-CM

## 2022-09-03 ENCOUNTER — Encounter: Payer: Self-pay | Admitting: Family Medicine

## 2022-09-04 ENCOUNTER — Ambulatory Visit
Admission: RE | Admit: 2022-09-04 | Discharge: 2022-09-04 | Disposition: A | Discharge status: Home / Self Care | Payer: Medicare PPO | Source: Ambulatory Visit | Attending: Obstetrics and Gynecology | Admitting: Obstetrics and Gynecology

## 2022-09-04 DIAGNOSIS — Z1231 Encounter for screening mammogram for malignant neoplasm of breast: Secondary | ICD-10-CM | POA: Diagnosis not present

## 2022-09-18 DIAGNOSIS — M25562 Pain in left knee: Secondary | ICD-10-CM | POA: Diagnosis not present

## 2022-09-20 DIAGNOSIS — M25561 Pain in right knee: Secondary | ICD-10-CM | POA: Diagnosis not present

## 2022-09-20 DIAGNOSIS — M7041 Prepatellar bursitis, right knee: Secondary | ICD-10-CM | POA: Diagnosis not present

## 2022-10-16 DIAGNOSIS — Z01419 Encounter for gynecological examination (general) (routine) without abnormal findings: Secondary | ICD-10-CM | POA: Diagnosis not present

## 2022-11-01 DIAGNOSIS — I788 Other diseases of capillaries: Secondary | ICD-10-CM | POA: Diagnosis not present

## 2022-11-01 DIAGNOSIS — D2271 Melanocytic nevi of right lower limb, including hip: Secondary | ICD-10-CM | POA: Diagnosis not present

## 2022-11-01 DIAGNOSIS — D2261 Melanocytic nevi of right upper limb, including shoulder: Secondary | ICD-10-CM | POA: Diagnosis not present

## 2022-11-01 DIAGNOSIS — L821 Other seborrheic keratosis: Secondary | ICD-10-CM | POA: Diagnosis not present

## 2022-11-01 DIAGNOSIS — D485 Neoplasm of uncertain behavior of skin: Secondary | ICD-10-CM | POA: Diagnosis not present

## 2022-11-01 DIAGNOSIS — D2262 Melanocytic nevi of left upper limb, including shoulder: Secondary | ICD-10-CM | POA: Diagnosis not present

## 2022-11-01 DIAGNOSIS — Z85820 Personal history of malignant melanoma of skin: Secondary | ICD-10-CM | POA: Diagnosis not present

## 2022-11-01 DIAGNOSIS — D225 Melanocytic nevi of trunk: Secondary | ICD-10-CM | POA: Diagnosis not present

## 2022-11-20 DIAGNOSIS — D225 Melanocytic nevi of trunk: Secondary | ICD-10-CM | POA: Diagnosis not present

## 2022-11-20 DIAGNOSIS — D485 Neoplasm of uncertain behavior of skin: Secondary | ICD-10-CM | POA: Diagnosis not present

## 2023-03-14 DIAGNOSIS — Z0000 Encounter for general adult medical examination without abnormal findings: Secondary | ICD-10-CM | POA: Diagnosis not present

## 2023-03-14 DIAGNOSIS — E559 Vitamin D deficiency, unspecified: Secondary | ICD-10-CM | POA: Diagnosis not present

## 2023-03-14 DIAGNOSIS — M25569 Pain in unspecified knee: Secondary | ICD-10-CM | POA: Diagnosis not present

## 2023-03-14 DIAGNOSIS — Z1159 Encounter for screening for other viral diseases: Secondary | ICD-10-CM | POA: Diagnosis not present

## 2023-03-14 DIAGNOSIS — M25561 Pain in right knee: Secondary | ICD-10-CM | POA: Diagnosis not present

## 2023-03-14 DIAGNOSIS — G8929 Other chronic pain: Secondary | ICD-10-CM | POA: Diagnosis not present

## 2023-03-14 DIAGNOSIS — M810 Age-related osteoporosis without current pathological fracture: Secondary | ICD-10-CM | POA: Diagnosis not present

## 2023-03-14 DIAGNOSIS — E782 Mixed hyperlipidemia: Secondary | ICD-10-CM | POA: Diagnosis not present

## 2023-03-14 DIAGNOSIS — Z681 Body mass index (BMI) 19 or less, adult: Secondary | ICD-10-CM | POA: Diagnosis not present

## 2023-03-14 DIAGNOSIS — R636 Underweight: Secondary | ICD-10-CM | POA: Diagnosis not present

## 2023-03-15 NOTE — Progress Notes (Signed)
Cardiology Office Note:  .   Date:  03/19/2023  ID:  Connie Soto [place], DOB 03-Sep-1955, MRN [id number] PCP: Camie Patience, FNP  [facility] Providers Cardiologist:  Meriam Sprague, MD (Inactive) Cardiology APP:  Dyann Kief, PA-C    History of Present Illness: .   Connie Soto is a 67 y.o. female    with history of palpitations, HLD-borderline diet-controlled and strong family history of early CAD.  Brother died suddenly at 13 of a presumed MI.  2D echo in 2016 normal.   Last saw Dr. Delton See 08/2016 who ordered a GXT as well as calcium score to risk stratify and determine if she needs therapy with statins.  Calcium score was 0 red yeast rice 600 mg daily was recommended.  GXT normal 09/2016.  Patient comes in for yearly f/u. Lipids done chol 236, LDL 136. Walks 30 min daily, 7000-8000 steps daily. She eats healthy. Denies chest pain, dyspnea. Has occasional palpitations if she doesn't drink enough water or eating properly or sleeping well.   ROS:    Studies Reviewed: Marland Kitchen    EKG Interpretation Date/Time:  Tuesday March 19 2023 09:45:21 EDT Ventricular Rate:  77 PR Interval:  130 QRS Duration:  82 QT Interval:  374 QTC Calculation: 423 R Axis:   95  Text Interpretation: Normal sinus rhythm Rightward axis No changes from prior tracings. Confirmed by Jacolyn Reedy [phone]) on 03/19/2023 9:54:37 AM    Prior CV Studies:   GXT 09/2016 Study Highlights      Blood pressure demonstrated a normal response to exercise. There was no ST segment deviation noted during stress. No T wave inversion was noted during stress.   Normal ECG stress test.      2D echo 2016Study Conclusions  - Left ventricle: The cavity size was normal. Wall thickness was   normal. Systolic function was normal. The estimated ejection   fraction was in the range of 50% to 55%. Wall motion was normal;   there were no regional wall motion abnormalities.   CT calcium score  3/2018IMPRESSION: Coronary calcium score of 0. This was 0 percentile for age and sex matched control.   Tobias Alexander     Electronically Signed   By: Tobias Alexander   On: 08/27/2016 10:46      Risk Assessment/Calculations:             Physical Exam:   VS:  BP 102/62   Pulse 81   Ht 5' (1.524 m)   Wt 95 lb (43.1 kg)   SpO2 97%   BMI 18.55 kg/m    Wt Readings from Last 3 Encounters:  03/19/23 95 lb (43.1 kg)  08/09/22 94 lb 12.8 oz (43 kg)  02/15/22 92 lb 9.6 oz (42 kg)    GEN: Well nourished, well developed in no acute distress NECK: No JVD; No carotid bruits CARDIAC:  RRR, no murmurs, rubs, gallops RESPIRATORY:  Clear to auscultation without rales, wheezing or rhonchi  ABDOMEN: Soft, non-tender, non-distended EXTREMITIES:  No edema; No deformity   ASSESSMENT AND PLAN: .    Family history of early CAD calcium score 0 on coronary CT 2018-no symptoms, exercising regularly.    Hyperlipidemia on red yeast rice  but going to try psyllium.  LDL 136  03/14/23. If still elevated or she doesn't tolerate psyllium consider repeat coronary calcium score since it's been 6 yrs.   History of palpitations occur if not properly hydrated, not enough sleep. Marland Kitchen  Dispo: f/u in 1 yr.  Signed, Jacolyn Reedy, PA-C

## 2023-03-19 ENCOUNTER — Ambulatory Visit: Payer: Medicare PPO | Attending: Physician Assistant | Admitting: Physician Assistant

## 2023-03-19 ENCOUNTER — Encounter: Payer: Self-pay | Admitting: Physician Assistant

## 2023-03-19 VITALS — BP 102/62 | HR 81 | Ht 60.0 in | Wt 95.0 lb

## 2023-03-19 DIAGNOSIS — R002 Palpitations: Secondary | ICD-10-CM

## 2023-03-19 DIAGNOSIS — E785 Hyperlipidemia, unspecified: Secondary | ICD-10-CM | POA: Diagnosis not present

## 2023-03-19 DIAGNOSIS — Z8249 Family history of ischemic heart disease and other diseases of the circulatory system: Secondary | ICD-10-CM

## 2023-03-19 NOTE — Patient Instructions (Signed)
Medication Instructions:  Your physician recommends that you continue on your current medications as directed. Please refer to the Current Medication list given to you today.   *If you need a refill on your cardiac medications before your next appointment, please call your pharmacy*   Lab Work: None ordered   If you have labs (blood work) drawn today and your tests are completely normal, you will receive your results only by: MyChart Message (if you have MyChart) OR A paper copy in the mail If you have any lab test that is abnormal or we need to change your treatment, we will call you to review the results.   Testing/Procedures: None ordered    Follow-Up: At [facility], you and your health needs are our priority.  As part of our continuing mission to provide you with exceptional heart care, we have created designated Provider Care Teams.  These Care Teams include your primary Cardiologist (physician) and Advanced Practice Providers (APPs -  Physician Assistants and Nurse Practitioners) who all work together to provide you with the care you need, when you need it.  We recommend signing up for the patient portal called "MyChart".  Sign up information is provided on this After Visit Summary.  MyChart is used to connect with patients for Virtual Visits (Telemedicine).  Patients are able to view lab/test results, encounter notes, upcoming appointments, etc.  Non-urgent messages can be sent to your provider as well.   To learn more about what you can do with MyChart, go to [email or web address].    Your next appointment:   12 month(s)  Provider:   Jacolyn Reedy, PA-C   Other Instructions

## 2023-04-16 ENCOUNTER — Ambulatory Visit
Admission: RE | Admit: 2023-04-16 | Discharge: 2023-04-16 | Disposition: A | Discharge status: Home / Self Care | Payer: Medicare PPO | Source: Ambulatory Visit | Attending: Sports Medicine | Admitting: Sports Medicine

## 2023-04-16 DIAGNOSIS — M810 Age-related osteoporosis without current pathological fracture: Secondary | ICD-10-CM

## 2023-04-16 DIAGNOSIS — M8588 Other specified disorders of bone density and structure, other site: Secondary | ICD-10-CM | POA: Diagnosis not present

## 2023-04-16 DIAGNOSIS — N958 Other specified menopausal and perimenopausal disorders: Secondary | ICD-10-CM | POA: Diagnosis not present

## 2023-04-16 DIAGNOSIS — E2839 Other primary ovarian failure: Secondary | ICD-10-CM | POA: Diagnosis not present

## 2023-04-24 DIAGNOSIS — M791 Myalgia, unspecified site: Secondary | ICD-10-CM | POA: Diagnosis not present

## 2023-04-24 DIAGNOSIS — M1712 Unilateral primary osteoarthritis, left knee: Secondary | ICD-10-CM | POA: Diagnosis not present

## 2023-04-24 DIAGNOSIS — M810 Age-related osteoporosis without current pathological fracture: Secondary | ICD-10-CM | POA: Diagnosis not present

## 2023-04-24 DIAGNOSIS — R5383 Other fatigue: Secondary | ICD-10-CM | POA: Diagnosis not present

## 2023-04-24 DIAGNOSIS — M2550 Pain in unspecified joint: Secondary | ICD-10-CM | POA: Diagnosis not present

## 2023-04-25 DIAGNOSIS — M1712 Unilateral primary osteoarthritis, left knee: Secondary | ICD-10-CM | POA: Diagnosis not present

## 2023-04-26 DIAGNOSIS — D485 Neoplasm of uncertain behavior of skin: Secondary | ICD-10-CM | POA: Diagnosis not present

## 2023-04-26 DIAGNOSIS — M793 Panniculitis, unspecified: Secondary | ICD-10-CM | POA: Diagnosis not present

## 2023-05-09 DIAGNOSIS — L03116 Cellulitis of left lower limb: Secondary | ICD-10-CM | POA: Diagnosis not present

## 2023-05-09 DIAGNOSIS — D485 Neoplasm of uncertain behavior of skin: Secondary | ICD-10-CM | POA: Diagnosis not present

## 2023-07-15 ENCOUNTER — Other Ambulatory Visit: Payer: Self-pay | Admitting: *Deleted

## 2023-07-15 DIAGNOSIS — E785 Hyperlipidemia, unspecified: Secondary | ICD-10-CM

## 2023-07-16 DIAGNOSIS — L52 Erythema nodosum: Secondary | ICD-10-CM | POA: Diagnosis not present

## 2023-07-16 DIAGNOSIS — L300 Nummular dermatitis: Secondary | ICD-10-CM | POA: Diagnosis not present

## 2023-07-16 DIAGNOSIS — D1801 Hemangioma of skin and subcutaneous tissue: Secondary | ICD-10-CM | POA: Diagnosis not present

## 2023-07-18 DIAGNOSIS — E785 Hyperlipidemia, unspecified: Secondary | ICD-10-CM | POA: Diagnosis not present

## 2023-07-19 LAB — LIPID PANEL
Chol/HDL Ratio: 2.5 {ratio} (ref 0.0–4.4)
Cholesterol, Total: 256 mg/dL — ABNORMAL HIGH (ref 100–199)
HDL: 102 mg/dL (ref >39)
LDL Chol Calc (NIH): 144 mg/dL — ABNORMAL HIGH (ref 0–99)
Triglycerides: 65 mg/dL (ref 0–149)
VLDL Cholesterol Cal: 10 mg/dL (ref 5–40)

## 2023-07-20 DIAGNOSIS — E785 Hyperlipidemia, unspecified: Secondary | ICD-10-CM

## 2023-07-20 DIAGNOSIS — Z8249 Family history of ischemic heart disease and other diseases of the circulatory system: Secondary | ICD-10-CM

## 2023-07-24 DIAGNOSIS — M810 Age-related osteoporosis without current pathological fracture: Secondary | ICD-10-CM | POA: Diagnosis not present

## 2023-07-24 DIAGNOSIS — R5383 Other fatigue: Secondary | ICD-10-CM | POA: Diagnosis not present

## 2023-07-24 DIAGNOSIS — M25561 Pain in right knee: Secondary | ICD-10-CM | POA: Diagnosis not present

## 2023-07-24 DIAGNOSIS — M25562 Pain in left knee: Secondary | ICD-10-CM | POA: Diagnosis not present

## 2023-07-25 ENCOUNTER — Other Ambulatory Visit: Payer: Self-pay

## 2023-07-29 ENCOUNTER — Telehealth: Payer: Self-pay

## 2023-07-29 NOTE — Telephone Encounter (Signed)
 Auth Submission: NO AUTH NEEDED Site of care: Site of care: [facility] Payer: [employer] Medication & CPT/J Code(s) submitted: Reclast (Zolendronic acid) [id number] Route of submission (phone, fax, portal): portal Phone # Fax # Auth type: Buy/Bill PB Units/visits requested: 1 dose, 5mg  Reference number:  Approval from: 07/29/23 to 06/03/24

## 2023-07-31 ENCOUNTER — Other Ambulatory Visit: Payer: Self-pay

## 2023-07-31 NOTE — Telephone Encounter (Signed)
 Auth Submission: NO AUTH NEEDED Site of care: Site of care: [facility] Payer: [employer] Medication & CPT/J Code(s) submitted: Reclast (Zolendronic acid) [id number] Route of submission (phone, fax, portal): portal Phone # Fax # Auth type: Buy/Bill PB Units/visits requested: 1 dose, 5mg  Reference number:  Approval from: 07/29/23 to 06/03/24

## 2023-08-06 ENCOUNTER — Encounter (HOSPITAL_COMMUNITY): Payer: Self-pay

## 2023-08-06 ENCOUNTER — Ambulatory Visit (HOSPITAL_COMMUNITY)
Admission: RE | Admit: 2023-08-06 | Discharge: 2023-08-06 | Disposition: A | Discharge status: Home / Self Care | Payer: Self-pay | Source: Ambulatory Visit | Attending: Physician Assistant | Admitting: Physician Assistant

## 2023-08-06 DIAGNOSIS — E785 Hyperlipidemia, unspecified: Secondary | ICD-10-CM | POA: Insufficient documentation

## 2023-08-06 DIAGNOSIS — Z8249 Family history of ischemic heart disease and other diseases of the circulatory system: Secondary | ICD-10-CM | POA: Insufficient documentation

## 2023-08-08 ENCOUNTER — Other Ambulatory Visit: Payer: Self-pay | Admitting: *Deleted

## 2023-08-08 DIAGNOSIS — E785 Hyperlipidemia, unspecified: Secondary | ICD-10-CM

## 2023-08-08 MED ORDER — ROSUVASTATIN CALCIUM 5 MG PO TABS: 5.0000 mg | ORAL_TABLET | Freq: Every day | ORAL | 3 refills | Status: DC

## 2023-08-13 ENCOUNTER — Ambulatory Visit: Payer: Medicare PPO

## 2023-08-13 VITALS — BP 103/63 | HR 71 | Temp 98.4°F | Resp 16 | Ht 60.0 in | Wt 96.0 lb

## 2023-08-13 DIAGNOSIS — M810 Age-related osteoporosis without current pathological fracture: Secondary | ICD-10-CM | POA: Diagnosis not present

## 2023-08-13 MED ORDER — ACETAMINOPHEN 325 MG PO TABS
650.0000 mg | ORAL_TABLET | Freq: Once | ORAL | Status: AC
  Administered 2023-08-13: 650 mg via ORAL
  Filled 2023-08-13: qty 2

## 2023-08-13 MED ORDER — DIPHENHYDRAMINE HCL 25 MG PO CAPS
25.0000 mg | ORAL_CAPSULE | Freq: Once | ORAL | Status: AC
  Administered 2023-08-13: 25 mg via ORAL
  Filled 2023-08-13: qty 1

## 2023-08-13 MED ORDER — ZOLEDRONIC ACID 5 MG/100ML IV SOLN
5.0000 mg | Freq: Once | INTRAVENOUS | Status: AC
  Administered 2023-08-13: 5 mg via INTRAVENOUS
  Filled 2023-08-13: qty 100

## 2023-08-13 NOTE — Progress Notes (Signed)
 Diagnosis: Osteoporosis  Provider:  Chilton Greathouse MD  Procedure: IV Infusion  IV Type: Peripheral, IV Location: R Antecubital  Reclast (Zolendronic Acid), Dose: 5 mg  Infusion Start Time: 1040  Infusion Stop Time: 1113  Post Infusion IV Care: Observation period completed and Peripheral IV Discontinued  Discharge: Condition: Good, Destination: Home . AVS Declined  Performed by:  Rico Ala, LPN

## 2023-08-13 NOTE — Addendum Note (Signed)
 Addended by: Salli Real R on: 08/13/2023 11:52 AM   Modules accepted: Orders

## 2023-08-19 ENCOUNTER — Other Ambulatory Visit: Payer: Self-pay | Admitting: Obstetrics and Gynecology

## 2023-08-19 DIAGNOSIS — Z0000 Encounter for general adult medical examination without abnormal findings: Secondary | ICD-10-CM

## 2023-09-05 ENCOUNTER — Ambulatory Visit
Admission: RE | Admit: 2023-09-05 | Discharge: 2023-09-05 | Disposition: A | Discharge status: Home / Self Care | Source: Ambulatory Visit | Attending: Obstetrics and Gynecology | Admitting: Obstetrics and Gynecology

## 2023-09-05 DIAGNOSIS — Z0000 Encounter for general adult medical examination without abnormal findings: Secondary | ICD-10-CM

## 2023-09-05 DIAGNOSIS — Z1231 Encounter for screening mammogram for malignant neoplasm of breast: Secondary | ICD-10-CM | POA: Diagnosis not present

## 2023-11-04 ENCOUNTER — Other Ambulatory Visit (HOSPITAL_COMMUNITY): Payer: Self-pay | Admitting: Family Medicine

## 2023-11-04 DIAGNOSIS — E785 Hyperlipidemia, unspecified: Secondary | ICD-10-CM | POA: Diagnosis not present

## 2023-11-05 ENCOUNTER — Ambulatory Visit: Payer: Self-pay | Admitting: Physician Assistant

## 2023-11-05 LAB — LIPID PANEL
Chol/HDL Ratio: 1.9 ratio (ref 0.0–4.4)
Cholesterol, Total: 191 mg/dL (ref 100–199)
HDL: 100 mg/dL (ref >39)
LDL Chol Calc (NIH): 81 mg/dL (ref 0–99)
Triglycerides: 51 mg/dL (ref 0–149)
VLDL Cholesterol Cal: 10 mg/dL (ref 5–40)

## 2023-11-07 ENCOUNTER — Telehealth: Payer: Self-pay | Admitting: Physician Assistant

## 2023-11-07 MED ORDER — ROSUVASTATIN CALCIUM 5 MG PO TABS: 5.0000 mg | ORAL_TABLET | Freq: Every day | ORAL | 1 refills | Status: AC

## 2023-11-07 NOTE — Telephone Encounter (Signed)
*  STAT* If patient is at the pharmacy, call can be transferred to refill team.   1. Which medications need to be refilled? (please list name of each medication and dose if known)   rosuvastatin  (CRESTOR ) 5 MG tablet     4. Which pharmacy/location (including street and city if local pharmacy) is medication to be sent to?  [id number] - [place],  - [address]     5. Do they need a 30 day or 90 day supply? 90

## 2023-11-07 NOTE — Telephone Encounter (Signed)
 Pt's medication was sent to pt's pharmacy as requested. Confirmation received.

## 2023-12-05 DIAGNOSIS — L52 Erythema nodosum: Secondary | ICD-10-CM | POA: Diagnosis not present

## 2023-12-05 DIAGNOSIS — D2271 Melanocytic nevi of right lower limb, including hip: Secondary | ICD-10-CM | POA: Diagnosis not present

## 2023-12-05 DIAGNOSIS — L218 Other seborrheic dermatitis: Secondary | ICD-10-CM | POA: Diagnosis not present

## 2023-12-05 DIAGNOSIS — D220 Melanocytic nevi of lip: Secondary | ICD-10-CM | POA: Diagnosis not present

## 2023-12-05 DIAGNOSIS — D225 Melanocytic nevi of trunk: Secondary | ICD-10-CM | POA: Diagnosis not present

## 2023-12-05 DIAGNOSIS — Z85820 Personal history of malignant melanoma of skin: Secondary | ICD-10-CM | POA: Diagnosis not present

## 2023-12-27 DIAGNOSIS — T63441A Toxic effect of venom of bees, accidental (unintentional), initial encounter: Secondary | ICD-10-CM | POA: Diagnosis not present

## 2023-12-27 DIAGNOSIS — L089 Local infection of the skin and subcutaneous tissue, unspecified: Secondary | ICD-10-CM | POA: Diagnosis not present

## 2023-12-27 DIAGNOSIS — T7840XA Allergy, unspecified, initial encounter: Secondary | ICD-10-CM | POA: Diagnosis not present

## 2024-01-02 DIAGNOSIS — H2513 Age-related nuclear cataract, bilateral: Secondary | ICD-10-CM | POA: Diagnosis not present

## 2024-01-02 DIAGNOSIS — H5203 Hypermetropia, bilateral: Secondary | ICD-10-CM | POA: Diagnosis not present

## 2024-01-02 DIAGNOSIS — D3132 Benign neoplasm of left choroid: Secondary | ICD-10-CM | POA: Diagnosis not present

## 2024-01-20 IMAGING — MG MM DIGITAL SCREENING BILAT W/ TOMO AND CAD
8 series · 9 of 24 positions shown · non-contrast
Comparison: Previous exam(s).

CLINICAL DATA: Screening.

EXAM:
DIGITAL SCREENING BILATERAL MAMMOGRAM WITH TOMOSYNTHESIS AND CAD
TECHNIQUE: Bilateral screening digital craniocaudal and mediolateral oblique
mammograms were obtained. Bilateral screening digital breast
tomosynthesis was performed. The images were evaluated with
computer-aided detection.

[R CC synth-2D]
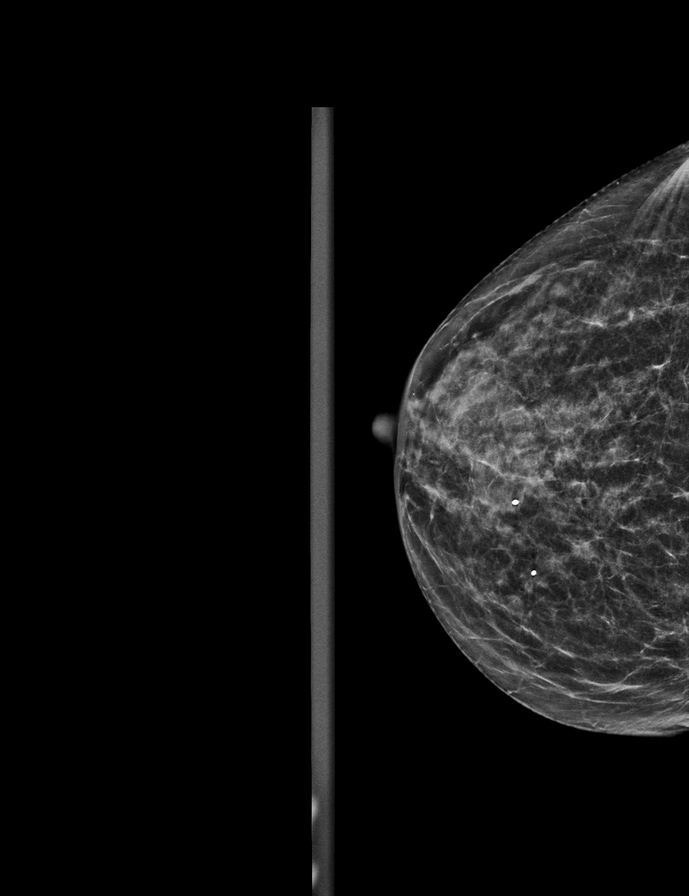

[R MLO synth-2D]
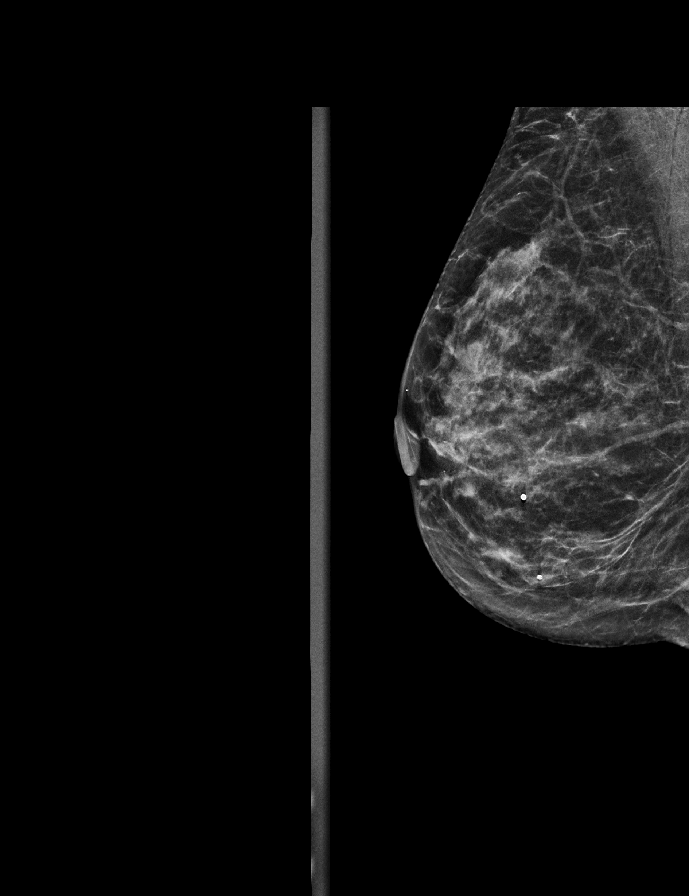

[L CC synth-2D]
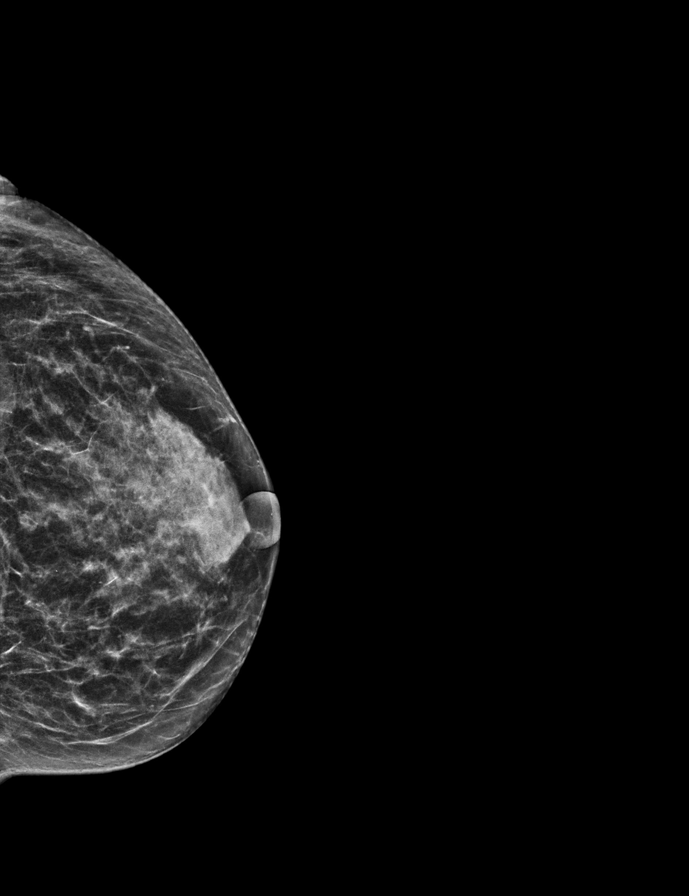

[L MLO synth-2D]
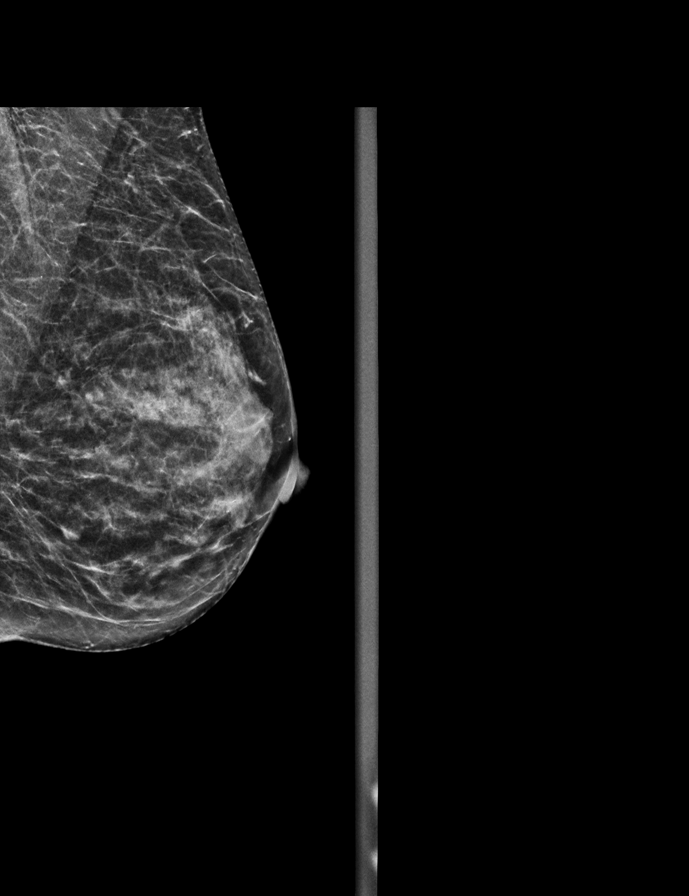

[R MLO tomo · 2 of 50 frames shown]
[frame 17/50]
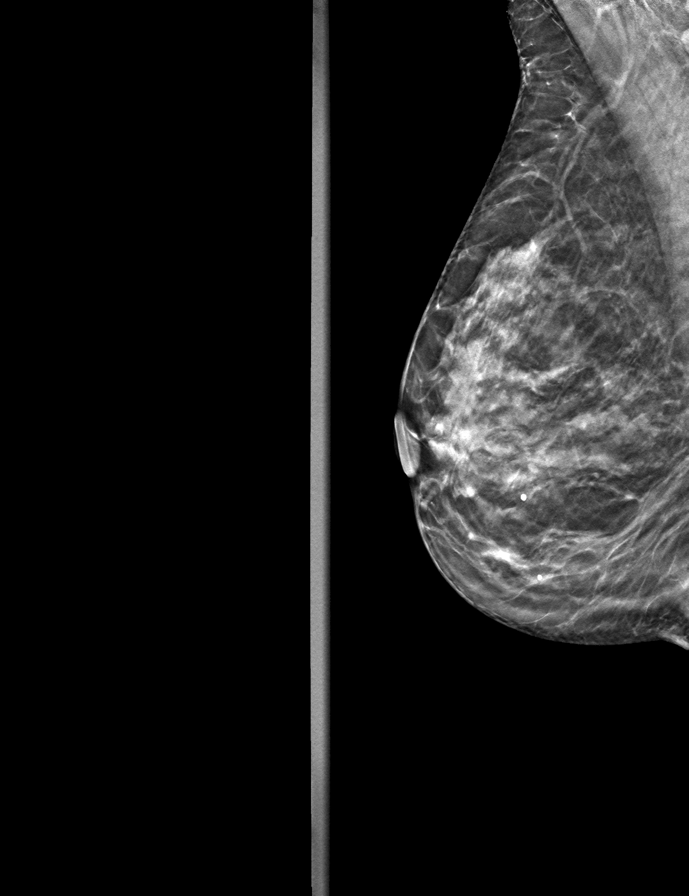
[frame 25/50]
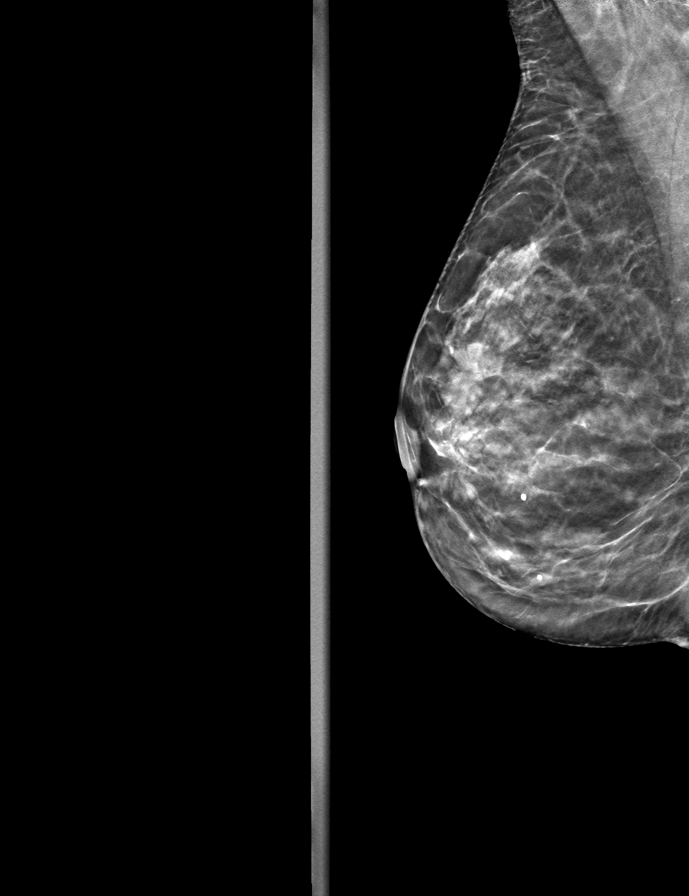

[R CC tomo · tomo slice 25/48.0]
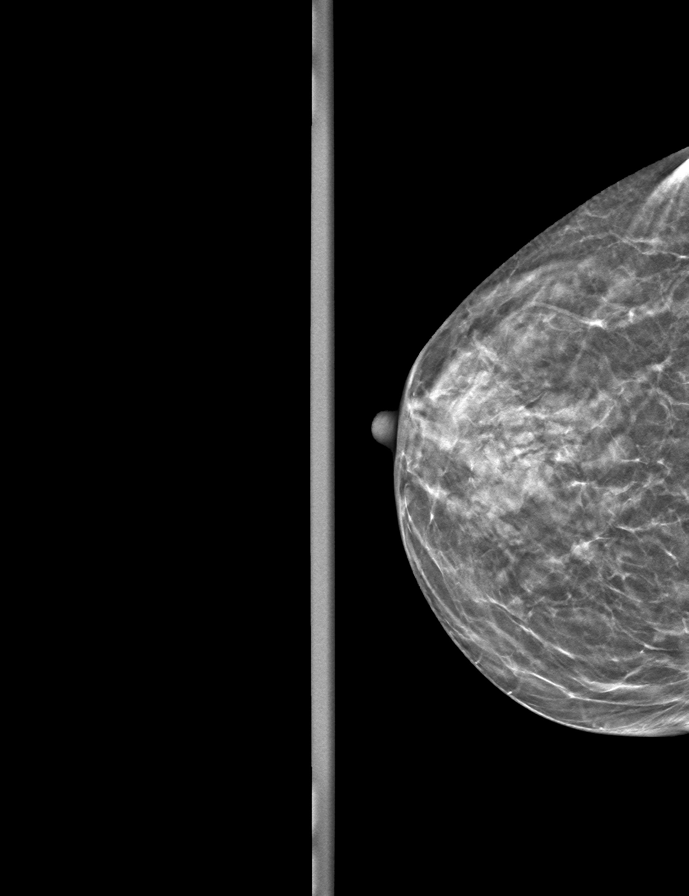

[L CC tomo · tomo slice 24/47.0]
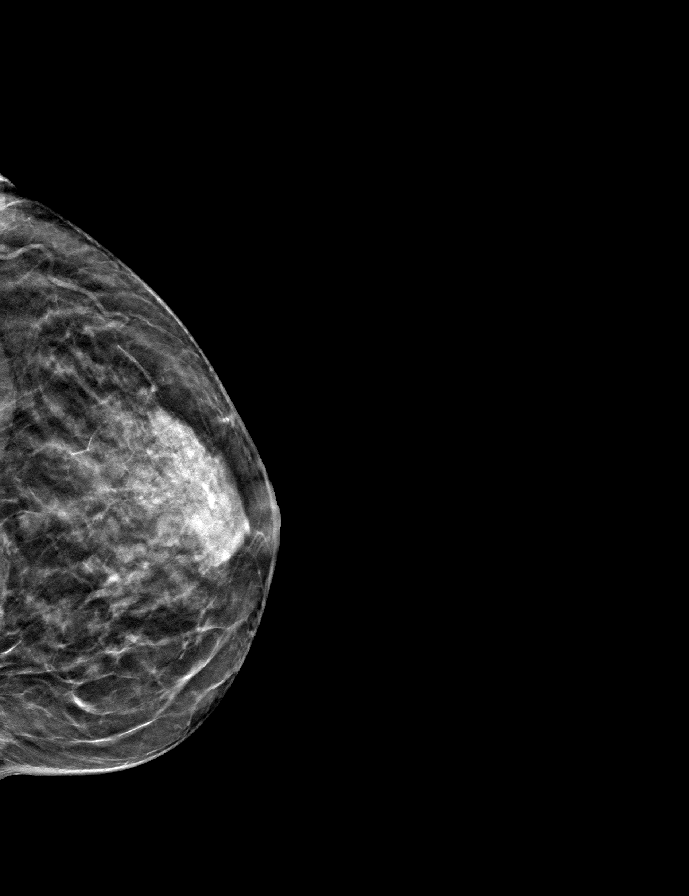

[L MLO tomo · tomo slice 24/47.0]
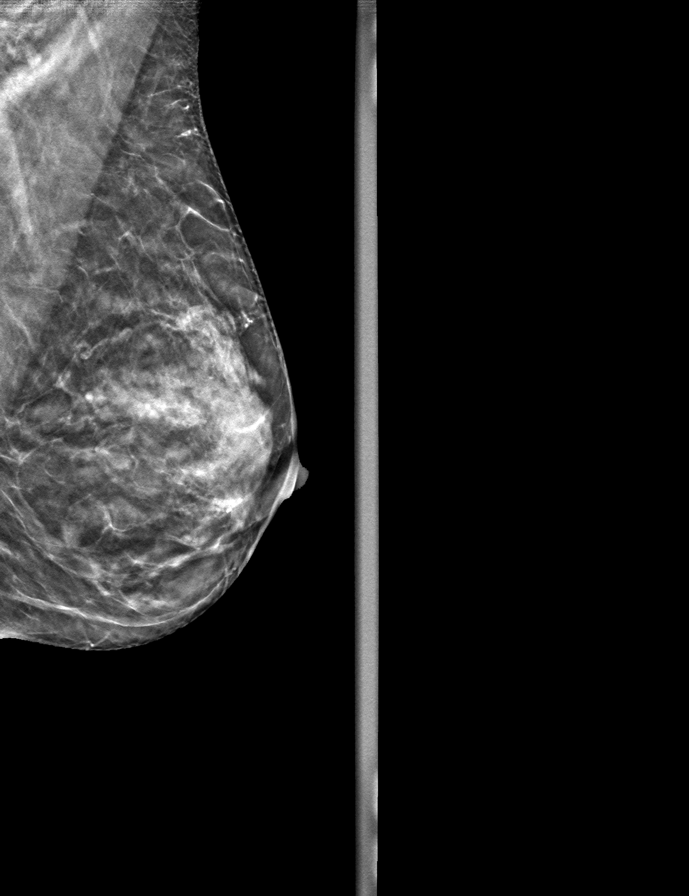

[9 of 24 positions shown; findings below may reference images not displayed]

ACR Breast Density Category c: The breast tissue is heterogeneously
dense, which may obscure small masses.
FINDINGS: There are no findings suspicious for malignancy.
IMPRESSION: No mammographic evidence of malignancy. A result letter of this
screening mammogram will be mailed directly to the patient.

RECOMMENDATION:
Screening mammogram in one year. (Code:Q3-W-BC3)

BI-RADS CATEGORY  1: Negative.

## 2024-03-23 DIAGNOSIS — R053 Chronic cough: Secondary | ICD-10-CM | POA: Diagnosis not present

## 2024-03-23 DIAGNOSIS — M810 Age-related osteoporosis without current pathological fracture: Secondary | ICD-10-CM | POA: Diagnosis not present

## 2024-03-23 DIAGNOSIS — E782 Mixed hyperlipidemia: Secondary | ICD-10-CM | POA: Diagnosis not present

## 2024-03-23 DIAGNOSIS — R636 Underweight: Secondary | ICD-10-CM | POA: Diagnosis not present

## 2024-03-23 DIAGNOSIS — J4521 Mild intermittent asthma with (acute) exacerbation: Secondary | ICD-10-CM | POA: Diagnosis not present

## 2024-03-23 DIAGNOSIS — E559 Vitamin D deficiency, unspecified: Secondary | ICD-10-CM | POA: Diagnosis not present

## 2024-03-23 DIAGNOSIS — Z0000 Encounter for general adult medical examination without abnormal findings: Secondary | ICD-10-CM | POA: Diagnosis not present

## 2024-03-23 DIAGNOSIS — Z681 Body mass index (BMI) 19 or less, adult: Secondary | ICD-10-CM | POA: Diagnosis not present

## 2024-03-23 DIAGNOSIS — M545 Low back pain, unspecified: Secondary | ICD-10-CM | POA: Diagnosis not present

## 2024-04-08 DIAGNOSIS — R053 Chronic cough: Secondary | ICD-10-CM | POA: Diagnosis not present
